# Patient Record
Sex: Male | Born: 1995 | Race: Black or African American | Hispanic: No | Marital: Single | Smoking: Current every day smoker
Health system: Southern US, Community
[De-identification: ages and names within clinical notes are randomized; demographics above are authoritative.]

## PROBLEM LIST (undated history)

## (undated) HISTORY — PX: TONSILLECTOMY: SUR1361

---

## 2019-03-15 ENCOUNTER — Other Ambulatory Visit: Payer: Self-pay

## 2019-03-15 ENCOUNTER — Ambulatory Visit: Payer: Self-pay | Admitting: Physician Assistant

## 2019-03-15 DIAGNOSIS — Z113 Encounter for screening for infections with a predominantly sexual mode of transmission: Secondary | ICD-10-CM

## 2019-03-15 LAB — GRAM STAIN

## 2019-03-16 ENCOUNTER — Encounter: Payer: Self-pay | Admitting: Physician Assistant

## 2019-03-16 NOTE — Progress Notes (Signed)
    STI clinic/screening visit  Subjective:  George Hislop. is a 23 y.o. male being seen today for an STI screening visit. The patient reports they do not have symptoms.  Patient has the following medical conditions:  There are no active problems to display for this patient.    Chief Complaint  Patient presents with  . SEXUALLY TRANSMITTED DISEASE    HPI  Patient reports that he does not have any symptoms and would like a screening today.    See flowsheet for further details and programmatic requirements.    The following portions of the patient's history were reviewed and updated as appropriate: allergies, current medications, past medical history, past social history, past surgical history and problem list.  Objective:  There were no vitals filed for this visit.  Physical Exam Constitutional:      General: He is not in acute distress.    Appearance: Normal appearance.  HENT:     Head: Normocephalic and atraumatic.     Mouth/Throat:     Mouth: Mucous membranes are moist.     Pharynx: Oropharynx is clear. No oropharyngeal exudate or posterior oropharyngeal erythema.  Eyes:     Conjunctiva/sclera: Conjunctivae normal.  Neck:     Musculoskeletal: Neck supple.  Pulmonary:     Effort: Pulmonary effort is normal.  Abdominal:     Palpations: Abdomen is soft. There is no mass.     Tenderness: There is no abdominal tenderness. There is no guarding or rebound.  Genitourinary:    Penis: Normal.      Scrotum/Testes: Normal.     Comments: Pubic area without nits, lice, edema, erythema, lesions and inguinal adenopathy. Penis circumcised and without discharge at meatus. Lymphadenopathy:     Cervical: No cervical adenopathy.  Skin:    General: Skin is warm and dry.     Findings: No bruising, erythema, lesion or rash.  Neurological:     Mental Status: He is alert and oriented to person, place, and time.  Psychiatric:        Mood and Affect: Mood normal.      Behavior: Behavior normal.        Thought Content: Thought content normal.        Judgment: Judgment normal.       Assessment and Plan:  George Cunningham. is a 23 y.o. male presenting to the [facility] Department for STI screening  1. Screening for STD (sexually transmitted disease) Patient is without symptoms today. Rec condoms with all sex. Await test results.  Counseled that RN will call if needs to RTC for treatment once results are back. - Gram stain - [facility] - Syphilis Serology, [facility] - GC/Chlamydia Probe Amp([facility])     No follow-ups on file.  No future appointments.  Jerene Dilling, PA

## 2019-03-17 LAB — GC/CHLAMYDIA PROBE AMP
Chlamydia trachomatis, NAA: NEGATIVE
Neisseria Gonorrhoeae by PCR: NEGATIVE

## 2019-09-12 ENCOUNTER — Emergency Department
Admission: EM | Admit: 2019-09-12 | Discharge: 2019-09-12 | Disposition: A | Discharge status: Home / Self Care | Payer: Self-pay | Attending: Student in an Organized Health Care Education/Training Program | Admitting: Student in an Organized Health Care Education/Training Program

## 2019-09-12 ENCOUNTER — Emergency Department: Payer: Self-pay

## 2019-09-12 ENCOUNTER — Other Ambulatory Visit: Payer: Self-pay

## 2019-09-12 DIAGNOSIS — M25531 Pain in right wrist: Secondary | ICD-10-CM | POA: Insufficient documentation

## 2019-09-12 DIAGNOSIS — Z008 Encounter for other general examination: Secondary | ICD-10-CM

## 2019-09-12 DIAGNOSIS — R1110 Vomiting, unspecified: Secondary | ICD-10-CM | POA: Insufficient documentation

## 2019-09-12 DIAGNOSIS — Z0289 Encounter for other administrative examinations: Secondary | ICD-10-CM | POA: Insufficient documentation

## 2019-09-12 DIAGNOSIS — M25561 Pain in right knee: Secondary | ICD-10-CM | POA: Insufficient documentation

## 2019-09-12 DIAGNOSIS — R064 Hyperventilation: Secondary | ICD-10-CM | POA: Insufficient documentation

## 2019-09-12 DIAGNOSIS — F17210 Nicotine dependence, cigarettes, uncomplicated: Secondary | ICD-10-CM | POA: Insufficient documentation

## 2019-09-12 DIAGNOSIS — M25562 Pain in left knee: Secondary | ICD-10-CM | POA: Insufficient documentation

## 2019-09-12 DIAGNOSIS — M25532 Pain in left wrist: Secondary | ICD-10-CM | POA: Insufficient documentation

## 2019-09-12 NOTE — ED Provider Notes (Signed)
[facility] Emergency Department Provider Note  ____________________________________________   First MD Initiated Contact with Patient 09/12/19 1800     (approximate)  I have reviewed the triage vital signs and the nursing notes.   HISTORY  Chief Complaint Shortness of Breath    HPI George Cunningham. is a 24 y.o. male presents emergency department via EMS and [place] [employer] custody.  Patient was running from the police and fell onto his knees and wrist.  I asked him why he was running and he said he was scared to the police.  He denies chest pain/shortness of breath but states he is having trouble breathing.  Vomited x1 in the ambulance per the [place] [employer].  No head injury, abdominal pain, is able to bear weight and walk    History reviewed. No pertinent past medical history.  There are no problems to display for this patient.   History reviewed. No pertinent surgical history.  Prior to Admission medications   Not on File    Allergies Patient has no known allergies.  History reviewed. No pertinent family history.  Social History Social History   Tobacco Use  . Smoking status: Current Every Day Smoker    Types: Cigarettes  . Smokeless tobacco: Never Used  Substance Use Topics  . Alcohol use: Never  . Drug use: Not Currently    Types: Marijuana    Review of Systems  Constitutional: No fever/chills Eyes: No visual changes. ENT: No sore throat. Respiratory: Denies cough, positive difficulty breathing Cardiovascular: Denies chest pain Gastrointestinal: Denies abdominal pain Genitourinary: Negative for dysuria. Musculoskeletal: Negative for back pain.  Positive left wrist pain, positive knee pain Skin: Negative for rash. Psychiatric: no mood changes,     ____________________________________________   PHYSICAL EXAM:  VITAL SIGNS: ED Triage Vitals  Enc Vitals Group     BP 09/12/19 1754 (!) 153/92     Pulse Rate 09/12/19 1754  (!) 122     Resp 09/12/19 1752 (!) 22     Temp 09/12/19 1754 98.1 F (36.7 C)     Temp Source 09/12/19 1754 Oral     SpO2 09/12/19 1754 96 %     Weight 09/12/19 1752 200 lb (90.7 kg)     Height 09/12/19 1752 6\' 1"  (1.854 m)     Head Circumference --      Peak Flow --      Pain Score 09/12/19 1752 0     Pain Loc --      Pain Edu? --      Excl. in [place]? --     Constitutional: Alert and oriented. Well appearing and in no acute distress.  Patient is hyperventilating Eyes: Conjunctivae are normal.  Head: Atraumatic. Nose: No congestion/rhinnorhea. Mouth/Throat: Mucous membranes are moist.   Neck:  supple no lymphadenopathy noted Cardiovascular: Normal rate, regular rhythm. Heart sounds are normal Respiratory: Normal respiratory effort.  No retractions, lungs c t a  Abd: soft nontender bs normal all 4 quad GU: deferred Musculoskeletal: FROM all extremities, warm and well perfused, left wrist is tender, right wrist is minimally tender, both knees are little tender but patient is able to bear weight Neurologic:  Normal speech and language.  Skin:  Skin is warm, dry and intact. No rash noted. Psychiatric: Mood and affect are normal. Speech and behavior are normal.  ____________________________________________   LABS (all labs ordered are listed, but only abnormal results are displayed)  Labs Reviewed - No data to display ____________________________________________  ____________________________________________  RADIOLOGY  Chest x-ray, x-ray of the left wrist Patient is refusing x-ray  ____________________________________________   PROCEDURES  Procedure(s) performed: No  Procedures    ____________________________________________   INITIAL IMPRESSION / ASSESSMENT AND PLAN / ED COURSE  Pertinent labs & imaging results that were available during my care of the patient were reviewed by me and considered in my medical decision making (see chart for details).   Patient  is 24 year old male presents emergency department in custody of the [place] [employer].  Patient was running from the police and fell.  He became short of breath while running and continues to say he is short of breath.  See HPI  Physical exam the patient is breathing normal when I am not evaluating him.  When I walk into the room he starts to hyperventilate.  Left wrist has no swelling or abrasions noted.  He states it hurts when palpated.  Left knees have dirt noted on his jeans, slightly tender to palpation but I did witness the patient ambulate without difficulty.  Chest x-ray left wrist x-ray Patient is refusing x-rays from the x-ray tech.  He will be discharged in stable condition in custody of [place] [employer].    George Cunningham. was evaluated in Emergency Department on 09/12/2019 for the symptoms described in the history of present illness. He was evaluated in the context of the global COVID-19 pandemic, which necessitated consideration that the patient might be at risk for infection with the SARS-CoV-2 virus that causes COVID-19. Institutional protocols and algorithms that pertain to the evaluation of patients at risk for COVID-19 are in a state of rapid change based on information released by regulatory bodies including the CDC and federal and state organizations. These policies and algorithms were followed during the patient's care in the ED.   As part of my medical decision making, I reviewed the following data within the electronic MEDICAL RECORD NUMBER Nursing notes reviewed and incorporated, Old chart reviewed, Notes from prior ED visits and [place] Controlled Substance Database  ____________________________________________   FINAL CLINICAL IMPRESSION(S) / ED DIAGNOSES  Final diagnoses:  Medical clearance for incarceration      NEW MEDICATIONS STARTED DURING THIS VISIT:  New Prescriptions   No medications on file     Note:  This document was prepared using Dragon voice recognition software  and may include unintentional dictation errors.    Faythe Ghee, PA-C 09/12/19 1816    Willy Eddy, MD 09/12/19 2038

## 2019-09-12 NOTE — ED Triage Notes (Addendum)
Pt arrives to ER via [facility] in [place] [employer] custody after running from police. Pt reported SOB. Pt had emesis X 1 in ambulance. Pt states he became SOB running from police. Reports bilateral knee pain when "falling" after running from police. No noted injuries at time of triage with patient's clothing on.  Pt needs medical clearance for [place] [employer]

## 2020-08-01 ENCOUNTER — Encounter: Payer: Self-pay | Admitting: Emergency Medicine

## 2020-08-01 ENCOUNTER — Other Ambulatory Visit: Payer: Self-pay

## 2020-08-01 ENCOUNTER — Emergency Department
Admission: EM | Admit: 2020-08-01 | Discharge: 2020-08-01 | Disposition: A | Discharge status: Home / Self Care | Payer: Self-pay | Attending: Emergency Medicine | Admitting: Emergency Medicine

## 2020-08-01 DIAGNOSIS — L03317 Cellulitis of buttock: Secondary | ICD-10-CM | POA: Insufficient documentation

## 2020-08-01 DIAGNOSIS — F17210 Nicotine dependence, cigarettes, uncomplicated: Secondary | ICD-10-CM | POA: Insufficient documentation

## 2020-08-01 DIAGNOSIS — R55 Syncope and collapse: Secondary | ICD-10-CM | POA: Insufficient documentation

## 2020-08-01 DIAGNOSIS — L0231 Cutaneous abscess of buttock: Secondary | ICD-10-CM | POA: Insufficient documentation

## 2020-08-01 LAB — URINALYSIS, COMPLETE (UACMP) WITH MICROSCOPIC
Bacteria, UA: NONE SEEN
Bilirubin Urine: NEGATIVE
Glucose, UA: NEGATIVE mg/dL
Hgb urine dipstick: NEGATIVE
Ketones, ur: NEGATIVE mg/dL
Leukocytes,Ua: NEGATIVE
Nitrite: NEGATIVE
Protein, ur: 30 mg/dL — AB
Specific Gravity, Urine: 1.031 — ABNORMAL HIGH (ref 1.005–1.030)
Squamous Epithelial / HPF: NONE SEEN (ref 0–5)
pH: 5 (ref 5.0–8.0)

## 2020-08-01 LAB — BASIC METABOLIC PANEL
Anion gap: 12 (ref 5–15)
BUN: 15 mg/dL (ref 6–20)
CO2: 23 mmol/L (ref 22–32)
Calcium: 9.5 mg/dL (ref 8.9–10.3)
Chloride: 100 mmol/L (ref 98–111)
Creatinine, Ser: 0.79 mg/dL (ref 0.61–1.24)
GFR, Estimated: >60 mL/min (ref >60)
Glucose, Bld: 176 mg/dL — ABNORMAL HIGH (ref 70–99)
Potassium: 4.2 mmol/L (ref 3.5–5.1)
Sodium: 135 mmol/L (ref 135–145)

## 2020-08-01 LAB — CBC
HCT: 44.8 % (ref 39.0–52.0)
Hemoglobin: 15.4 g/dL (ref 13.0–17.0)
MCH: 30 pg (ref 26.0–34.0)
MCHC: 34.4 g/dL (ref 30.0–36.0)
MCV: 87.3 fL (ref 80.0–100.0)
Platelets: 224 10*3/uL (ref 150–400)
RBC: 5.13 MIL/uL (ref 4.22–5.81)
RDW: 12.7 % (ref 11.5–15.5)
WBC: 11.1 10*3/uL — ABNORMAL HIGH (ref 4.0–10.5)
nRBC: 0 % (ref 0.0–0.2)

## 2020-08-01 MED ORDER — DOXYCYCLINE HYCLATE 100 MG PO TABS
100.0000 mg | ORAL_TABLET | Freq: Once | ORAL | Status: AC
  Administered 2020-08-01: 100 mg via ORAL
  Filled 2020-08-01: qty 1

## 2020-08-01 MED ORDER — DOXYCYCLINE HYCLATE 100 MG PO CAPS: 100.0000 mg | ORAL_CAPSULE | Freq: Two times a day (BID) | ORAL | 0 refills | Status: DC

## 2020-08-01 MED ORDER — ONDANSETRON 4 MG PO TBDP
4.0000 mg | ORAL_TABLET | Freq: Once | ORAL | Status: AC
  Administered 2020-08-01: 4 mg via ORAL
  Filled 2020-08-01: qty 1

## 2020-08-01 MED ORDER — LIDOCAINE-EPINEPHRINE 2 %-1:100000 IJ SOLN
20.0000 mL | Freq: Once | INTRAMUSCULAR | Status: AC
  Administered 2020-08-01: 20 mL via INTRADERMAL
  Filled 2020-08-01: qty 1

## 2020-08-01 MED ORDER — OXYCODONE-ACETAMINOPHEN 5-325 MG PO TABS
2.0000 | ORAL_TABLET | Freq: Once | ORAL | Status: AC
  Administered 2020-08-01: 2 via ORAL
  Filled 2020-08-01: qty 2

## 2020-08-01 NOTE — ED Triage Notes (Addendum)
Pt to ED via POV stating that he has an abscess on his buttocks. Pt states that last night he got up to go get a bath cloth and got very lightheaded and dizzy. Pt reports that he had syncopal episode. Pt girlfriend told him that he was shaking. Pt does not have hx/o seizures. Pt is in NAD. Pt reports that he donated plasma yesterday.

## 2020-08-01 NOTE — ED Provider Notes (Signed)
[facility] Emergency Department Provider Note  ____________________________________________   Event Date/Time   First MD Initiated Contact with Patient 08/01/20 1515     (approximate)  I have reviewed the triage vital signs and the nursing notes.   HISTORY  Chief Complaint Loss of Consciousness and Abscess    HPI George Cunningham. is a 25 y.o. male  Here with abscess, syncope.  Patient states that over the last several days, he has had progressively worsening gluteal pain and swelling.  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  He states that he has had increasing pain from this.  He states that he went to the bathroom to have a bowel movement and try to apply a warm compress yesterday.  When he got up after the bathroom to go to the other room, he felt very lightheaded and dizzy.  He then had what he describes as a sensation of dizziness and tunnel vision followed by a possible brief syncopal episode.  His symptoms then resolved.  He states that he went to his bed and slept.  He felt better.  Today, he has had increasing pain and swelling along his gluteal area so he presents for repeat evaluation.  Denies any chest pain.  No history of syncope.  No cardiac history.  No medication changes.         History reviewed. No pertinent past medical history.  There are no problems to display for this patient.   Past Surgical History:  Procedure Laterality Date  . TONSILLECTOMY      Prior to Admission medications   Medication Sig Start Date End Date Taking? Authorizing Provider  doxycycline (VIBRAMYCIN) 100 MG capsule Take 1 capsule (100 mg total) by mouth 2 (two) times daily for 7 days. 08/01/20 08/08/20 Yes Shaune Pollack, MD    Allergies Patient has no known allergies.  No family history on file.  Social History Social History   Tobacco Use  . Smoking status:  Current Every Day Smoker    Types: Cigarettes  . Smokeless tobacco: Never Used  Substance Use Topics  . Alcohol use: Never  . Drug use: Yes    Types: Marijuana    Review of Systems  Review of Systems  Constitutional: Positive for fatigue. Negative for chills and fever.  HENT: Negative for sore throat.   Respiratory: Negative for shortness of breath.   Cardiovascular: Negative for chest pain.  Gastrointestinal: Negative for abdominal pain.  Genitourinary: Negative for flank pain.  Musculoskeletal: Negative for neck pain.  Skin: Positive for rash and wound.  Allergic/Immunologic: Negative for immunocompromised state.  Neurological: Positive for syncope. Negative for weakness and numbness.  Hematological: Does not bruise/bleed easily.  All other systems reviewed and are negative.    ____________________________________________  PHYSICAL EXAM:      VITAL SIGNS: ED Triage Vitals  Enc Vitals Group     BP 08/01/20 1445 (!) 137/97     Pulse Rate 08/01/20 1445 92     Resp 08/01/20 1445 16     Temp 08/01/20 1445 98.4 F (36.9 C)     Temp Source 08/01/20 1445 Oral     SpO2 08/01/20 1445 96 %     Weight 08/01/20 1442 230 lb (104.3 kg)     Height 08/01/20 1442 6' 4.5" (1.943 m)     Head Circumference --      Peak Flow --  Pain Score 08/01/20 1442 10     Pain Loc --      Pain Edu? --      Excl. in [place]? --      Physical Exam Vitals and nursing note reviewed.  Constitutional:      General: He is not in acute distress.    Appearance: He is well-developed.  HENT:     Head: Normocephalic and atraumatic.  Eyes:     Conjunctiva/sclera: Conjunctivae normal.  Cardiovascular:     Rate and Rhythm: Normal rate and regular rhythm.     Heart sounds: Normal heart sounds. No murmur heard. No friction rub.  Pulmonary:     Effort: Pulmonary effort is normal. No respiratory distress.     Breath sounds: Normal breath sounds. No wheezing or rales.  Abdominal:     General: There is  no distension.     Palpations: Abdomen is soft.     Tenderness: There is no abdominal tenderness.  Genitourinary:    Comments: Approximately 3 x 2 cm fluctuant area of abscess along the left inferior medial buttock/gluteal cleft.  Areas of prior drainage and scarring noted.  No extension to the perirectal area. Musculoskeletal:     Cervical back: Neck supple.  Skin:    General: Skin is warm.     Capillary Refill: Capillary refill takes less than 2 seconds.  Neurological:     Mental Status: He is alert and oriented to person, place, and time.     Motor: No abnormal muscle tone.       ____________________________________________   LABS (all labs ordered are listed, but only abnormal results are displayed)  Labs Reviewed  BASIC METABOLIC PANEL - Abnormal; Notable for the following components:      Result Value   Glucose, Bld 176 (*)    All other components within normal limits  CBC - Abnormal; Notable for the following components:   WBC 11.1 (*)    All other components within normal limits  URINALYSIS, COMPLETE (UACMP) WITH MICROSCOPIC - Abnormal; Notable for the following components:   Color, Urine YELLOW (*)    APPearance HAZY (*)    Specific Gravity, Urine 1.031 (*)    Protein, ur 30 (*)    All other components within normal limits  CBG MONITORING, ED    ____________________________________________  EKG: Normal sinus rhythm, ventricular rate 91.  PR 170, QRS 96, QTc 430.  No acute ST elevations or depression.  No acute events of acute ischemia or infarct. ________________________________________  RADIOLOGY All imaging, including plain films, CT scans, and ultrasounds, independently reviewed by me, and interpretations confirmed via formal radiology reads.  ED MD interpretation:     Official radiology report(s): No results found.  ____________________________________________  PROCEDURES   Procedure(s) performed (including Critical Care):  .1-3 Lead EKG  Interpretation Performed by: Shaune Pollack, MD Authorized by: Shaune Pollack, MD     Interpretation: normal     ECG rate:  80-100   ECG rate assessment: normal     Rhythm: sinus rhythm     Ectopy: none     Conduction: normal   Comments:     Indication: syncope .Marland KitchenIncision and Drainage  Date/Time: 08/01/2020 6:31 PM Performed by: Shaune Pollack, MD Authorized by: Shaune Pollack, MD   Consent:    Consent obtained:  Verbal   Consent given by:  Patient   Risks discussed:  Bleeding, damage to other organs, incomplete drainage and pain   Alternatives discussed:  Alternative treatment Universal protocol:  Patient identity confirmed:  Verbally with patient Location:    Type:  Abscess   Size:  3x2   Location:  Anogenital   Anogenital location:  Gluteal cleft Pre-procedure details:    Skin preparation:  Antiseptic wash Sedation:    Sedation type:  None Anesthesia:    Anesthesia method:  Local infiltration   Local anesthetic:  Lidocaine 1% WITH epi Procedure type:    Complexity:  Simple Procedure details:    Incision types:  Single straight   Incision depth:  Dermal   Wound management:  Probed and deloculated and irrigated with saline   Drainage:  Purulent   Drainage amount:  Copious   Wound treatment:  Wound left open   Packing materials:  1/4 in iodoform gauze Post-procedure details:    Procedure completion:  Tolerated    ____________________________________________  INITIAL IMPRESSION / MDM / ASSESSMENT AND PLAN / ED COURSE  As part of my medical decision making, I reviewed the following data within the electronic MEDICAL RECORD NUMBER Nursing notes reviewed and incorporated, Old chart reviewed, Notes from prior ED visits, and [place] Controlled Substance Database       *George Mallnthony Andre Dealmeida Jr. was evaluated in Emergency Department on 08/01/2020 for the symptoms described in the history of present illness. He was evaluated in the context of the global COVID-19  pandemic, which necessitated consideration that the patient might be at risk for infection with the SARS-CoV-2 virus that causes COVID-19. Institutional protocols and algorithms that pertain to the evaluation of patients at risk for COVID-19 are in a state of rapid change based on information released by regulatory bodies including the CDC and federal and state organizations. These policies and algorithms were followed during the patient's care in the ED.  Some ED evaluations and interventions may be delayed as a result of limited staffing during the pandemic.*     Medical Decision Making: 25 year old male here with syncopal episode and gluteal abscess.  Suspect syncope in the setting of possible vasovagal reaction to pain as well as possible orthostasis in the setting of standing up from recent bowel movement.  His EKG here is normal with no arrhythmia or abnormalities. Lab work shows mild dehydration but otherwise is unremarkable.  No significant anemia.  Electrolytes largely within normal limits.  Mild hyperglycemia is likely reactive in the setting of infection.  No high risk features for syncope.  He feels better here.  Regarding his gluteal abscess, this was drained.  Clinically, symptoms are consistent with likely recurrent folliculitis in the setting of shaving in the area.  He has a history of multiple prior issues.  No signs of sepsis.  Drained and packed as above.  Will place on doxycycline, advised warm compresses, and outpatient follow-up.  Instructed him to avoid shaving this area, avoid scented soaps, ____________________________________________  FINAL CLINICAL IMPRESSION(S) / ED DIAGNOSES  Final diagnoses:  Vasovagal syncope  Cellulitis and abscess of buttock     MEDICATIONS GIVEN DURING THIS VISIT:  Medications  oxyCODONE-acetaminophen (PERCOCET/ROXICET) 5-325 MG per tablet 2 tablet (2 tablets Oral Given 08/01/20 1617)  lidocaine-EPINEPHrine (XYLOCAINE W/EPI) 2 %-1:100000 (with  pres) injection 20 mL (20 mLs Intradermal Given 08/01/20 1617)  doxycycline (VIBRA-TABS) tablet 100 mg (100 mg Oral Given 08/01/20 1617)  ondansetron (ZOFRAN-ODT) disintegrating tablet 4 mg (4 mg Oral Given 08/01/20 1617)     ED Discharge Orders         Ordered    doxycycline (VIBRAMYCIN) 100 MG capsule  2 times daily  08/01/20 1824           Note:  This document was prepared using Dragon voice recognition software and may include unintentional dictation errors.   Shaune Pollack, MD 08/01/20 [id number]

## 2020-08-01 NOTE — ED Notes (Signed)
Discharge instructions reviewed with pt. Pt calm , collective.   

## 2020-08-01 NOTE — Discharge Instructions (Addendum)
For your wound:  Apply warm compresses 2-3 x daily Soak in a warm bath at least daily Take the antibiotics Return to the Er or your doctor in 48 hours for packing removal  Do not shave the area directly, esp with a blade razor  Use non-scented soap in the area

## 2020-08-03 ENCOUNTER — Other Ambulatory Visit: Payer: Self-pay

## 2020-08-03 ENCOUNTER — Encounter: Payer: Self-pay | Admitting: Emergency Medicine

## 2020-08-03 ENCOUNTER — Emergency Department
Admission: EM | Admit: 2020-08-03 | Discharge: 2020-08-03 | Disposition: A | Discharge status: Home / Self Care | Payer: Self-pay | Attending: Emergency Medicine | Admitting: Emergency Medicine

## 2020-08-03 DIAGNOSIS — Z48817 Encounter for surgical aftercare following surgery on the skin and subcutaneous tissue: Secondary | ICD-10-CM | POA: Insufficient documentation

## 2020-08-03 DIAGNOSIS — L89319 Pressure ulcer of right buttock, unspecified stage: Secondary | ICD-10-CM | POA: Insufficient documentation

## 2020-08-03 DIAGNOSIS — Z5189 Encounter for other specified aftercare: Secondary | ICD-10-CM

## 2020-08-03 DIAGNOSIS — F17210 Nicotine dependence, cigarettes, uncomplicated: Secondary | ICD-10-CM | POA: Insufficient documentation

## 2020-08-03 DIAGNOSIS — Z79899 Other long term (current) drug therapy: Secondary | ICD-10-CM | POA: Insufficient documentation

## 2020-08-03 MED ORDER — SULFAMETHOXAZOLE-TRIMETHOPRIM 800-160 MG PO TABS
1.0000 | ORAL_TABLET | Freq: Once | ORAL | Status: AC
  Administered 2020-08-03: 1 via ORAL
  Filled 2020-08-03: qty 1

## 2020-08-03 MED ORDER — SULFAMETHOXAZOLE-TRIMETHOPRIM 800-160 MG PO TABS: 1.0000 | ORAL_TABLET | Freq: Two times a day (BID) | ORAL | 0 refills | Status: DC

## 2020-08-03 MED ORDER — HYDROCODONE-ACETAMINOPHEN 5-325 MG PO TABS
1.0000 | ORAL_TABLET | Freq: Once | ORAL | Status: AC
  Administered 2020-08-03: 1 via ORAL
  Filled 2020-08-03: qty 1

## 2020-08-03 NOTE — ED Notes (Addendum)
Pt presents to the ED for removal of the packing from his abscess in  that was drained two days ago. Pt states that the pain and swelling has gone down a little. Pt states he is unable to pick up his antibiotic because he doesn't have insurance and the medication was expensive. Pt is A&Ox4 and NAD.

## 2020-08-03 NOTE — Discharge Instructions (Signed)
Continue to monitor the wound for any changes.  Apply warm compresses over the dressing to promote healing.  Take the doxycycline antibiotic as directed until all pills are gone.  Follow-up with your primary provider return to the ED if needed.

## 2020-08-03 NOTE — ED Provider Notes (Incomplete)
[facility] Emergency Department Provider Note ____________________________________________  Time seen: 2303  I have reviewed the triage vital signs and the nursing notes.  HISTORY  Chief Complaint  packing removal   HPI George Cunningham. is a 25 y.o. male ***   History reviewed. No pertinent past medical history.  There are no problems to display for this patient.   Past Surgical History:  Procedure Laterality Date  . TONSILLECTOMY      Prior to Admission medications   Medication Sig Start Date End Date Taking? Authorizing Provider  sulfamethoxazole-trimethoprim (BACTRIM DS) 800-160 MG tablet Take 1 tablet by mouth 2 (two) times daily. 08/03/20  Yes Christapher Gillian, Charlesetta Ivory, PA-C    Allergies Patient has no known allergies.  History reviewed. No pertinent family history.  Social History Social History   Tobacco Use  . Smoking status: Current Every Day Smoker    Types: Cigarettes  . Smokeless tobacco: Never Used  Substance Use Topics  . Alcohol use: Never  . Drug use: Yes    Types: Marijuana    Review of Systems  Constitutional: Negative for fever. Eyes: Negative for visual changes. ENT: Negative for sore throat. Cardiovascular: Negative for chest pain. Respiratory: Negative for shortness of breath. Gastrointestinal: Negative for abdominal pain, vomiting and diarrhea. Genitourinary: Negative for dysuria. Musculoskeletal: Negative for back pain. Skin: Negative for rash. Neurological: Negative for headaches, focal weakness or numbness. ____________________________________________  PHYSICAL EXAM:  VITAL SIGNS: ED Triage Vitals  Enc Vitals Group     BP 08/03/20 2228 (!) 148/98     Pulse Rate 08/03/20 2228 100     Resp 08/03/20 2228 20     Temp 08/03/20 2228 98.8 F (37.1 C)     Temp Source 08/03/20 2228 Oral     SpO2 08/03/20 2228 100 %     Weight 08/03/20 2217 230 lb (104.3 kg)     Height 08/03/20 2217 6\' 4"  (1.93 m)      Head Circumference --      Peak Flow --      Pain Score 08/03/20 2217 8     Pain Loc --      Pain Edu? --      Excl. in GC? --     Constitutional: Alert and oriented. Well appearing and in no distress. Head: Normocephalic and atraumatic. Eyes: Conjunctivae are normal. PERRL. Normal extraocular movements Ears: Canals clear. TMs intact bilaterally. Nose: No congestion/rhinorrhea/epistaxis. Mouth/Throat: Mucous membranes are moist. Neck: Supple. No thyromegaly. Hematological/Lymphatic/Immunological: No cervical lymphadenopathy. Cardiovascular: Normal rate, regular rhythm. Normal distal pulses. Respiratory: Normal respiratory effort. No wheezes/rales/rhonchi. Gastrointestinal: Soft and nontender. No distention. Musculoskeletal: Nontender with normal range of motion in all extremities.  Neurologic:  Normal gait without ataxia. Normal speech and language. No gross focal neurologic deficits are appreciated. Skin:  Skin is warm, dry and intact. No rash noted. Psychiatric: Mood and affect are normal. Patient exhibits appropriate insight and judgment. ____________________________________________    {***LABS (pertinent positives/negatives)***}  ____________________________________________  {***EKG***}  ____________________________________________   {***RADIOLOGY***}  ____________________________________________  PROCEDURES  *** Procedures ____________________________________________  INITIAL IMPRESSION / ASSESSMENT AND PLAN / ED COURSE  {***summary/treatment plan - medications ?***}     George Cunningham. was evaluated in Emergency Department on 08/03/2020 for the symptoms described in the history of present illness. He was evaluated in the context of the global COVID-19 pandemic, which necessitated consideration that the patient might be at risk for infection with the SARS-CoV-2 virus that causes COVID-19.  Institutional protocols and algorithms that pertain to the  evaluation of patients at risk for COVID-19 are in a state of rapid change based on information released by regulatory bodies including the CDC and federal and state organizations. These policies and algorithms were followed during the patient's care in the ED.  I reviewed the patient's prescription history over the last 12 months in the multi-state controlled substances database(s) that includes [place], [place], [place], [place], [place], [place], [place], [place], [place], [place], [place], [place], [place], and [place].  Results were notable for *** ____________________________________________  FINAL CLINICAL IMPRESSION(S) / ED DIAGNOSES  Final diagnoses:  Wound check, abscess

## 2020-08-03 NOTE — ED Provider Notes (Signed)
[facility] Emergency Department Provider Note ____________________________________________  Time seen: 2303  I have reviewed the triage vital signs and the nursing notes.  HISTORY  Chief Complaint  packing removal  HPI Artrell Lawless. is a 25 y.o. male presents to the ED for interim wound check.  Patient was evaluated here in the ED 3 days prior for a  colitis of the buttock.  He had a local I&D procedure performed that time, and iodoform packing was placed.  He presents today for wound check and packing removal.  Patient admits that he did not pick up the doxycycline antibiotic, noting that the pharmacy gave him a retail price of nearly $400.  He presents today without any interim fever, chills, or sweats.  He been managing the wound with daily wound changes.  He denies any other complaints.  History reviewed. No pertinent past medical history.  There are no problems to display for this patient.   Past Surgical History:  Procedure Laterality Date  . TONSILLECTOMY      Prior to Admission medications   Medication Sig Start Date End Date Taking? Authorizing Provider  sulfamethoxazole-trimethoprim (BACTRIM DS) 800-160 MG tablet Take 1 tablet by mouth 2 (two) times daily. 08/03/20  Yes Nohelani Benning, Charlesetta Ivory, PA-C    Allergies Patient has no known allergies.  History reviewed. No pertinent family history.  Social History Social History   Tobacco Use  . Smoking status: Current Every Day Smoker    Types: Cigarettes  . Smokeless tobacco: Never Used  Substance Use Topics  . Alcohol use: Never  . Drug use: Yes    Types: Marijuana    Review of Systems  Constitutional: Negative for fever. Cardiovascular: Negative for chest pain. Respiratory: Negative for shortness of breath. Gastrointestinal: Negative for abdominal pain, vomiting and diarrhea. Genitourinary: Negative for dysuria. Musculoskeletal: Negative for back pain. Skin: Negative for  rash.  Right buttocks abscess as above. Neurological: Negative for headaches, focal weakness or numbness. ____________________________________________  PHYSICAL EXAM:  VITAL SIGNS: ED Triage Vitals  Enc Vitals Group     BP 08/03/20 2228 (!) 148/98     Pulse Rate 08/03/20 2228 100     Resp 08/03/20 2228 20     Temp 08/03/20 2228 98.8 F (37.1 C)     Temp Source 08/03/20 2228 Oral     SpO2 08/03/20 2228 100 %     Weight 08/03/20 2217 230 lb (104.3 kg)     Height 08/03/20 2217 6\' 4"  (1.93 m)     Head Circumference --      Peak Flow --      Pain Score 08/03/20 2217 8     Pain Loc --      Pain Edu? --      Excl. in GC? --     Constitutional: Alert and oriented. Well appearing and in no distress. Head: Normocephalic and atraumatic. Eyes: Conjunctivae are normal. Normal extraocular movements Cardiovascular: Normal rate, regular rhythm. Normal distal pulses. Respiratory: Normal respiratory effort. No wheezes/rales/rhonchi. Gastrointestinal: Soft and nontender. No distention. Musculoskeletal: Nontender with normal range of motion in all extremities.  Neurologic:  Normal gait without ataxia. Normal speech and language. No gross focal neurologic deficits are appreciated. Skin:  Skin is warm, dry and intact. No rash noted.  Patient with a healing right buttocks abscess on presentation.  He has a 4 cm area of in duration without significant erythema.  No central wound consistent with a previous I&D.  Iodoform packing  is in place.  Packing was removed without difficulty and no significant purulent drainage is expressed.  The wound flushed clean with saline. Psychiatric: Mood and affect are normal. Patient exhibits appropriate insight and judgment. ____________________________________________  PROCEDURES  Bactrim DS 1 p.o. Norco 5-3 25 p.o. Dry sterile dressing  Procedures ____________________________________________  INITIAL IMPRESSION / ASSESSMENT AND PLAN / ED COURSE  ED  evaluation management of a right buttocks abscess status post I&D procedure.  Patient presents without any interim complaints.  The wound appears to be healing well with no fluctuance or overlying erythema.  The packing was removed without difficulty and the patient's wound was covered with a non-stick dressing.  He was given an dose of Bactrim in the ED, and a prescription for the same.  Patient was advised to make contact with the ED if the antibiotic was again out of his price range.  He is given a discount prescription card to help defray cost.  He will keep the wound clean, dry, and covered.  Apply warm compress to help promote healing.  Return precautions have been discussed.  No interim follow-up is required.    Carel Carrier. was evaluated in Emergency Department on 08/04/2020 for the symptoms described in the history of present illness. He was evaluated in the context of the global COVID-19 pandemic, which necessitated consideration that the patient might be at risk for infection with the SARS-CoV-2 virus that causes COVID-19. Institutional protocols and algorithms that pertain to the evaluation of patients at risk for COVID-19 are in a state of rapid change based on information released by regulatory bodies including the CDC and federal and state organizations. These policies and algorithms were followed during the patient's care in the ED. ____________________________________________  FINAL CLINICAL IMPRESSION(S) / ED DIAGNOSES  Final diagnoses:  Wound check, abscess      Karmen Stabs, Charlesetta Ivory, PA-C 08/04/20 0009    Chesley Noon, MD 08/04/20 [phone]

## 2020-08-03 NOTE — ED Triage Notes (Signed)
Pt states is here to have his packing from his abscess removed. Pt states was told to return to ed to have packing removed. Pt appears in no acute distress.

## 2020-10-06 ENCOUNTER — Other Ambulatory Visit: Payer: Self-pay

## 2020-10-06 ENCOUNTER — Emergency Department
Admission: EM | Admit: 2020-10-06 | Discharge: 2020-10-06 | Disposition: A | Discharge status: Home / Self Care | Payer: Self-pay | Attending: Emergency Medicine | Admitting: Emergency Medicine

## 2020-10-06 DIAGNOSIS — F17210 Nicotine dependence, cigarettes, uncomplicated: Secondary | ICD-10-CM | POA: Insufficient documentation

## 2020-10-06 DIAGNOSIS — H60333 Swimmer's ear, bilateral: Secondary | ICD-10-CM | POA: Insufficient documentation

## 2020-10-06 MED ORDER — CIPROFLOXACIN-DEXAMETHASONE 0.3-0.1 % OT SUSP
4.0000 [drp] | Freq: Once | OTIC | Status: AC
  Administered 2020-10-06: 4 [drp] via OTIC
  Filled 2020-10-06: qty 7.5

## 2020-10-06 MED ORDER — CIPROFLOXACIN-DEXAMETHASONE 0.3-0.1 % OT SUSP: 4.0000 [drp] | Freq: Two times a day (BID) | OTIC | 0 refills | Status: AC

## 2020-10-06 NOTE — Discharge Instructions (Addendum)
Apply antibiotic eardrops 4 drops to each ear twice daily x7 days.  Return to the ER for worsening symptoms, persistent vomiting, fever or other concerns.

## 2020-10-06 NOTE — ED Provider Notes (Signed)
[facility] Emergency Department Provider Note   ____________________________________________   Event Date/Time   First MD Initiated Contact with Patient 10/06/20 [phone]     (approximate)  I have reviewed the triage vital signs and the nursing notes.   HISTORY  Chief Complaint Ear Pain    HPI George Cunningham. is a 25 y.o. male who presents to the ED from home with bilateral ear pain, right> left.  Patient reports recent trip to the beach and feels like both ears are clogged up with possible foreign body.  States his girlfriend noticed discharge from his right ear.  Denies fever, chills, nausea, vomiting or dizziness.     Past medical history None  There are no problems to display for this patient.   Past Surgical History:  Procedure Laterality Date  . TONSILLECTOMY      Prior to Admission medications   Medication Sig Start Date End Date Taking? Authorizing Provider  ciprofloxacin-dexamethasone (CIPRODEX) OTIC suspension Place 4 drops into both ears 2 (two) times daily for 7 days. 10/06/20 10/13/20 Yes Irean Hong, MD  sulfamethoxazole-trimethoprim (BACTRIM DS) 800-160 MG tablet Take 1 tablet by mouth 2 (two) times daily. 08/03/20   Menshew, Charlesetta Ivory, PA-C    Allergies Patient has no known allergies.  No family history on file.  Social History Social History   Tobacco Use  . Smoking status: Current Every Day Smoker    Types: Cigarettes  . Smokeless tobacco: Never Used  Substance Use Topics  . Alcohol use: Never  . Drug use: Yes    Types: Marijuana    Review of Systems  Constitutional: No fever/chills Eyes: No visual changes. ENT: Positive for bilateral ear pain.  No sore throat. Cardiovascular: Denies chest pain. Respiratory: Denies shortness of breath. Gastrointestinal: No abdominal pain.  No nausea, no vomiting.  No diarrhea.  No constipation. Genitourinary: Negative for dysuria. Musculoskeletal: Negative for back  pain. Skin: Negative for rash. Neurological: Negative for headaches, focal weakness or numbness.   ____________________________________________   PHYSICAL EXAM:  VITAL SIGNS: ED Triage Vitals [10/06/20 0219]  Enc Vitals Group     BP (!) 157/102     Pulse Rate 100     Resp 16     Temp 99 F (37.2 C)     Temp Source Oral     SpO2 100 %     Weight 239 lb (108.4 kg)     Height 6\' 5"  (1.956 m)     Head Circumference      Peak Flow      Pain Score 4     Pain Loc      Pain Edu?      Excl. in GC?     Constitutional: Alert and oriented. Well appearing and in no acute distress. Eyes: Conjunctivae are normal. PERRL. EOMI. Ears: Bilateral TMs intact without rupture.  No tenderness to mastoid process.  Debris and cerumen noted to bilateral external ear canals. Head: Atraumatic. Nose: No congestion/rhinnorhea. Mouth/Throat: Mucous membranes are moist.  Oropharynx non-erythematous. Neck: No stridor.   Cardiovascular: Normal rate, regular rhythm. Grossly normal heart sounds.  Good peripheral circulation. Respiratory: Normal respiratory effort.  No retractions. Lungs CTAB. Gastrointestinal: Soft and nontender. No distention. No abdominal bruits. No CVA tenderness. Musculoskeletal: No lower extremity tenderness nor edema.  No joint effusions. Neurologic:  Normal speech and language. No gross focal neurologic deficits are appreciated. No gait instability. Skin:  Skin is warm, dry and intact. No rash  noted. Psychiatric: Mood and affect are normal. Speech and behavior are normal.  ____________________________________________   LABS (all labs ordered are listed, but only abnormal results are displayed)  Labs Reviewed - No data to display ____________________________________________  EKG  None ____________________________________________  RADIOLOGY I, Ransome Helwig J, personally viewed and evaluated these images (plain radiographs) as part of my medical decision making, as well as  reviewing the written report by the radiologist.  ED MD interpretation: None  Official radiology report(s): No results found.  ____________________________________________   PROCEDURES  Procedure(s) performed (including Critical Care):  Procedures   ____________________________________________   INITIAL IMPRESSION / ASSESSMENT AND PLAN / ED COURSE  As part of my medical decision making, I reviewed the following data within the electronic MEDICAL RECORD NUMBER Nursing notes reviewed and incorporated and Notes from prior ED visits     25 year old male presenting with bilateral otitis externa.  Will apply Ciprodex drops.  Strict return precautions given.  Patient verbalizes understanding agrees with plan of care.      ____________________________________________   FINAL CLINICAL IMPRESSION(S) / ED DIAGNOSES  Final diagnoses:  Acute swimmer's ear of both sides     ED Discharge Orders         Ordered    ciprofloxacin-dexamethasone (CIPRODEX) OTIC suspension  2 times daily        10/06/20 0348          *Please note:  George Cunningham. was evaluated in Emergency Department on 10/06/2020 for the symptoms described in the history of present illness. He was evaluated in the context of the global COVID-19 pandemic, which necessitated consideration that the patient might be at risk for infection with the SARS-CoV-2 virus that causes COVID-19. Institutional protocols and algorithms that pertain to the evaluation of patients at risk for COVID-19 are in a state of rapid change based on information released by regulatory bodies including the CDC and federal and state organizations. These policies and algorithms were followed during the patient's care in the ED.  Some ED evaluations and interventions may be delayed as a result of limited staffing during and the pandemic.*   Note:  This document was prepared using Dragon voice recognition software and may include unintentional  dictation errors.   Irean Hong, MD 10/06/20 [phone]

## 2020-10-06 NOTE — ED Triage Notes (Signed)
Pt states right ear pain and sensation of possible foreign body for last 10 minutes. Pt appears in no acute distress.

## 2020-12-05 ENCOUNTER — Emergency Department: Payer: Self-pay

## 2020-12-05 ENCOUNTER — Emergency Department
Admission: EM | Admit: 2020-12-05 | Discharge: 2020-12-05 | Disposition: A | Discharge status: Home / Self Care | Payer: Self-pay | Attending: Emergency Medicine | Admitting: Emergency Medicine

## 2020-12-05 ENCOUNTER — Other Ambulatory Visit: Payer: Self-pay

## 2020-12-05 DIAGNOSIS — N492 Inflammatory disorders of scrotum: Secondary | ICD-10-CM | POA: Insufficient documentation

## 2020-12-05 DIAGNOSIS — L0291 Cutaneous abscess, unspecified: Secondary | ICD-10-CM

## 2020-12-05 DIAGNOSIS — F17210 Nicotine dependence, cigarettes, uncomplicated: Secondary | ICD-10-CM | POA: Insufficient documentation

## 2020-12-05 LAB — URINALYSIS, COMPLETE (UACMP) WITH MICROSCOPIC
Bilirubin Urine: NEGATIVE
Glucose, UA: >=500 mg/dL — AB
Hgb urine dipstick: NEGATIVE
Ketones, ur: 5 mg/dL — AB
Leukocytes,Ua: NEGATIVE
Nitrite: NEGATIVE
Protein, ur: NEGATIVE mg/dL
Specific Gravity, Urine: 1.026 (ref 1.005–1.030)
pH: 6 (ref 5.0–8.0)

## 2020-12-05 LAB — CHLAMYDIA/NGC RT PCR (ARMC ONLY)
Chlamydia Tr: NOT DETECTED
N gonorrhoeae: NOT DETECTED

## 2020-12-05 MED ORDER — LIDOCAINE HCL (PF) 1 % IJ SOLN
5.0000 mL | Freq: Once | INTRAMUSCULAR | Status: AC
  Administered 2020-12-05: 5 mL via INTRADERMAL
  Filled 2020-12-05: qty 5

## 2020-12-05 MED ORDER — DOXYCYCLINE HYCLATE 100 MG PO CAPS: 100.0000 mg | ORAL_CAPSULE | Freq: Two times a day (BID) | ORAL | 0 refills | Status: AC

## 2020-12-05 MED ORDER — BUPIVACAINE HCL (PF) 0.5 % IJ SOLN
10.0000 mL | Freq: Once | INTRAMUSCULAR | Status: AC
  Administered 2020-12-05: 10 mL
  Filled 2020-12-05: qty 10

## 2020-12-05 MED ORDER — IBUPROFEN 600 MG PO TABS: 600.0000 mg | ORAL_TABLET | Freq: Three times a day (TID) | ORAL | 0 refills | Status: DC | PRN

## 2020-12-05 NOTE — Consult Note (Signed)
Full consult to follow. Scrotal abscess. I&D kit, lidocaine, packing to bedside- discussed with Nurse.

## 2020-12-05 NOTE — ED Provider Notes (Signed)
[facility] Emergency Department Provider Note  ____________________________________________   Event Date/Time   First MD Initiated Contact with Patient 12/05/20 1554     (approximate)  I have reviewed the triage vital signs and the nursing notes.   HISTORY  Chief Complaint Abscess   HPI George Cunningham. is a 25 y.o. male who presents to the emergency department for evaluation of pain near his rectum.  States that has been worsening for the last few days, thinks that he has an abscess.  Denies significant history of previous.  Denies any dysuria, penile discharge, fever, chills or other systemic symptoms.         No past medical history on file.  There are no problems to display for this patient.   Past Surgical History:  Procedure Laterality Date   TONSILLECTOMY      Prior to Admission medications   Not on File    Allergies Patient has no known allergies.  No family history on file.  Social History Social History   Tobacco Use   Smoking status: Every Day    Pack years: 0.00    Types: Cigarettes   Smokeless tobacco: Never  Substance Use Topics   Alcohol use: Never   Drug use: Yes    Types: Marijuana    Review of Systems Constitutional: No fever/chills Eyes: No visual changes. ENT: No sore throat. Cardiovascular: Denies chest pain. Respiratory: Denies shortness of breath. Gastrointestinal: No abdominal pain.  No nausea, no vomiting.  No diarrhea.  No constipation. Genitourinary: Negative for dysuria. Musculoskeletal: Negative for back pain. Skin: Negative for rash. Neurological: Negative for headaches, focal weakness or numbness.   ____________________________________________   PHYSICAL EXAM:  VITAL SIGNS: ED Triage Vitals  Enc Vitals Group     BP 12/05/20 1106 140/87     Pulse Rate 12/05/20 1106 90     Resp 12/05/20 1106 18     Temp 12/05/20 1106 (!) 97.5 F (36.4 C)     Temp Source 12/05/20 1106  Oral     SpO2 12/05/20 1106 97 %     Weight 12/05/20 1107 236 lb (107 kg)     Height 12/05/20 1107 6\' 5"  (1.956 m)     Head Circumference --      Peak Flow --      Pain Score 12/05/20 1106 10     Pain Loc --      Pain Edu? --      Excl. in GC? --     Constitutional: Alert and oriented. Well appearing and in no acute distress. Eyes: Conjunctivae are normal. PERRL. EOMI. Head: Atraumatic. Nose: No congestion/rhinnorhea. Mouth/Throat: Mucous membranes are moist.  Neck: No stridor.   Cardiovascular: Normal rate, regular rhythm. Grossly normal heart sounds.  Good peripheral circulation. Respiratory: Normal respiratory effort.  No retractions. Lungs CTAB. Gastrointestinal: Soft and nontender. No distention. No abdominal bruits. No CVA tenderness. Genitourinary: There is noted area of tenderness with fluctuance and surrounding induration of the posterior wall of the right side of the scrotum.  No active draining.  No urethral discharge or other changes noted. Musculoskeletal: No lower extremity tenderness nor edema.  No joint effusions. Neurologic:  Normal speech and language. No gross focal neurologic deficits are appreciated. No gait instability. Skin:  Skin is warm, dry and intact. No rash noted. Psychiatric: Mood and affect are normal. Speech and behavior are normal.  ____________________________________________   LABS (all labs ordered are listed, but only abnormal results are  displayed)  Labs Reviewed  URINALYSIS, COMPLETE (UACMP) WITH MICROSCOPIC - Abnormal; Notable for the following components:      Result Value   Color, Urine YELLOW (*)    APPearance HAZY (*)    Glucose, UA >=500 (*)    Ketones, ur 5 (*)    Bacteria, UA RARE (*)    All other components within normal limits  CHLAMYDIA/NGC RT PCR ([facility] ONLY)             ____________________________________________  RADIOLOGY  Official radiology report(s): US SCROTUM W/DOPPLER  Result Date: 12/05/2020 CLINICAL DATA:   Scrotal wall abscess. EXAM: SCROTAL ULTRASOUND DOPPLER ULTRASOUND OF THE TESTICLES TECHNIQUE: Complete ultrasound examination of the testicles, epididymis, and other scrotal structures was performed. Color and spectral Doppler ultrasound were also utilized to evaluate blood flow to the testicles. COMPARISON:  None. FINDINGS: Right testicle Measurements: 5.0 x 2.7 x 3.3 cm. No mass or microlithiasis visualized. Left testicle Measurements: 5.3 x 2.3 x 3.2 cm. No mass or microlithiasis visualized. Right epididymis:  Normal in size and appearance. Left epididymis:  Normal in size and appearance. Hydrocele:  Small bilateral hydroceles. Varicocele:  None visualized. Pulsed Doppler interrogation of both testes demonstrates normal low resistance arterial and venous waveforms bilaterally. There is a 4.4 x 1.5 x 2.3 cm complex fluid collection with surrounding hyperemia along the underside of the right scrotum corresponding to the area of clinical concern. IMPRESSION: 1. 4.4 x 1.5 x 2.3 cm abscess along the underside of the right scrotum, corresponding to the area of clinical concern. 2. Normal sonographic appearance of the testicles. Electronically Signed   By: Obie Dredge M.D.   On: 12/05/2020 14:35     ____________________________________________   INITIAL IMPRESSION / ASSESSMENT AND PLAN / ED COURSE  As part of my medical decision making, I reviewed the following data within the electronic MEDICAL RECORD NUMBER Nursing notes reviewed and incorporated, Labs reviewed, A consult was requested and obtained from this/these consultant(s) Urology, Evaluated by EM attending Dr. Erma Heritage, and Notes from prior ED visits        Patient is a 25 year old male who presents to the emergency department for evaluation of pain in his genital region.  He states that this has been worsening over the last 3 days.  See HPI.  Exam as above.  Urinalysis was obtained and demonstrates rare bacteria with no leukocytes or nitrates.  GC  and Chlamydia are negative.  Ultrasound was obtained of the scrotum given location of the suspected abscess.  Ultrasound demonstrates a 4.4 x 1.5 x 2.3 cm abscess on the underside of the right side of the scrotum.  Case was discussed with Dr. Erma Heritage, who recommended urology consult.  Dr. Mena Goes of on-call urology was consulted.  He returned call to nursing asking for bedside supplies for drainage.  At this time, patient is being handed off to attending Dr. Erma Heritage, who will continue to await urology seeing the patient and likely discharge after that time.  Patient stable at this time for transfer to Dr. Erma Heritage.  Clinical Course as of 12/05/20 1630  Fri Dec 05, 2020  1506 Spoke with Dr. Mena Goes of urology, who is reviewing the patient's imaging to determine next steps  [CR]    Clinical Course User Index [CR] Lucy Chris, PA     ____________________________________________   FINAL CLINICAL IMPRESSION(S) / ED DIAGNOSES  Final diagnoses:  Scrotal wall abscess     ED Discharge Orders     None  Note:  This document was prepared using Dragon voice recognition software and may include unintentional dictation errors.    Lucy Chris, PA 12/05/20 1636    Shaune Pollack, MD 12/10/20 [phone]

## 2020-12-05 NOTE — ED Triage Notes (Signed)
Pt here with a abscess that started 3 days ago. Pt states that it came up on the right inside of his buttocks and has not went down and is painful.

## 2020-12-05 NOTE — Discharge Instructions (Addendum)
It is OK to remove the packing tomorrow.  Take the antibiotics as prescribed.  Incision and Drainage, Care After This sheet gives you information about how to care for yourself after your procedure. Your health care provider may also give you more specific instructions. If you have problems or questions, contact your health careprovider. What can I expect after the procedure? After the procedure, it is common to have: Pain or discomfort around the incision site. Blood, fluid, or pus (drainage) from the incision. Redness and firm skin around the incision site. Follow these instructions at home: Medicines Take over-the-counter and prescription medicines only as told by your health care provider. If you were prescribed an antibiotic medicine, use or take it as told by your health care provider. Do not stop using the antibiotic even if you start to feel better. Wound care  Follow instructions from your health care provider about how to take care of your wound. Make sure you: Wash your hands with soap and water before and after you change your bandage (dressing). If soap and water are not available, use hand sanitizer. Change your dressing and packing as told by your health care provider. If your dressing is dry or stuck when you try to remove it, moisten or wet the dressing with saline or water so that it can be removed without harming your skin or tissues. If your wound is packed, leave it in place until your health care provider tells you to remove it. To remove the packing, moisten or wet the packing with saline or water so that it can be removed without harming your skin or tissues. Leave stitches (sutures), skin glue, or adhesive strips in place. These skin closures may need to stay in place for 2 weeks or longer. If adhesive strip edges start to loosen and curl up, you may trim the loose edges. Do not remove adhesive strips completely unless your health care provider tells you to do  that. Check your wound every day for signs of infection. Check for: More redness, swelling, or pain. More fluid or blood. Warmth. Pus or a bad smell. If you were sent home with a drain tube in place, follow instructions from your health care provider about: How to empty it. How to care for it at home.   General instructions Rest the affected area. Do not take baths, swim, or use a hot tub until your health care provider approves. Ask your health care provider if you may take showers. You may only be allowed to take sponge baths. Return to your normal activities as told by your health care provider. Ask your health care provider what activities are safe for you. Your health care provider may put you on activity or lifting restrictions. The incision will continue to drain. It is normal to have some clear or slightly bloody drainage. The amount of drainage should lessen each day. Do not apply any creams, ointments, or liquids unless you have been told to by your health care provider. Keep all follow-up visits as told by your health care provider. This is important. Contact a health care provider if: Your cyst or abscess returns. You have a fever or chills. You have more redness, swelling, or pain around your incision. You have more fluid or blood coming from your incision. Your incision feels warm to the touch. You have pus or a bad smell coming from your incision. You have red streaks above or below the incision site. Get help right away if: You have  severe pain or bleeding. You cannot eat or drink without vomiting. You have decreased urine output. You become short of breath. You have chest pain. You cough up blood. The affected area becomes numb or starts to tingle. These symptoms may represent a serious problem that is an emergency. Do not wait to see if the symptoms will go away. Get medical help right away. Call your local emergency services (911 in the [place] Do not drive yourself  to the hospital. Summary After this procedure, it is common to have fluid, blood, or pus coming from the surgery site. Follow all home care instructions. You will be told how to take care of your incision, how to check for infection, and how to take medicines. If you were prescribed an antibiotic medicine, take it as told by your health care provider. Do not stop taking the antibiotic even if you start to feel better. Contact a health care provider if you have increased redness, swelling, or pain around your incision. Get help right away if you have chest pain, you vomit, you cough up blood, or you have shortness of breath. Keep all follow-up visits as told by your health care provider. This is important. This information is not intended to replace advice given to you by your health care provider. Make sure you discuss any questions you have with your healthcare provider. Document Revised: 05/01/2018 Document Reviewed: 05/01/2018 Elsevier Patient Education  2022 [employer].

## 2020-12-05 NOTE — Consult Note (Signed)
Consultation: Perineal abscess Requested by: Dr. Shaune Pollack  History of Present Illness: George Cunningham is a 25 year old male who has developed an abscess of the right perineum at the base of the scrotum.  He has had some pain, swelling and induration in the area for a few days that seemed more severe today.  He has had no fever.  He had a similar episode in the left perineum or thigh previously.  No past medical history on file. Past Surgical History:  Procedure Laterality Date   TONSILLECTOMY      Home Medications:  (Not in a hospital admission)  Allergies: No Known Allergies  No family history on file. Social History:  reports that he has been smoking cigarettes. He has never used smokeless tobacco. He reports current drug use. Drug: Marijuana. He reports that he does not drink alcohol.  ROS: A complete review of systems was performed.  All systems are negative except for pertinent findings as noted. Review of Systems  All other systems reviewed and are negative.   Physical Exam:  Vital signs in last 24 hours: Temp:  [97.5 F (36.4 C)] 97.5 F (36.4 C) (06/24 1106) Pulse Rate:  [90] 90 (06/24 1106) Resp:  [18] 18 (06/24 1106) BP: (140)/(87) 140/87 (06/24 1106) SpO2:  [97 %] 97 % (06/24 1106) Weight:  [107 kg] 107 kg (06/24 1107) General:  Alert and oriented, No acute distress HEENT: Normocephalic, atraumatic Neck: No JVD or lymphadenopathy Cardiovascular: Regular rate and rhythm Lungs: Regular rate and effort Abdomen: Soft, nontender, nondistended, no abdominal masses Back: No CVA tenderness Extremities: No edema Neurologic: Grossly intact GU: Penis circumcised and without mass or lesion, scrotum normal, testicles descended bilaterally and palpably normal.  At the base of the right scrotum on the right perineum there is about a 4 cm fluctuant abscess with some induration around it.  There is no crepitus or necrosis of the skin.  There is no involvement further  posterior around the perianal area.  It is coming to a head with a slight whitish area and fluctuant spot.  Procedure: I discussed with him the nature risk benefits and alternatives to incision and drainage and he consented to proceed.  The right abscess was prepped with Betadine and then a 50-50 mixture of Marcaine lidocaine with epi was instilled in the skin about 5 cc.  Already the abscess began to drain pus.  It was incised with an 11 blade and the pus drained.  I probed under the indurated skin superiorly and inferiorly to break up all the abscess cavity.  I then poured some saline in the Betadine to create irrigation.  I copiously irrigated the abscess cavity.  It was then packed with iodoform covered with gauze.  Laboratory Data:  Results for orders placed or performed during the hospital encounter of 12/05/20 (from the past 24 hour(s))  Urinalysis, Complete w Microscopic Urine, Random     Status: Abnormal   Collection Time: 12/05/20 12:14 PM  Result Value Ref Range   Color, Urine YELLOW (A) YELLOW   APPearance HAZY (A) CLEAR   Specific Gravity, Urine 1.026 1.005 - 1.030   pH 6.0 5.0 - 8.0   Glucose, UA >=500 (A) NEGATIVE mg/dL   Hgb urine dipstick NEGATIVE NEGATIVE   Bilirubin Urine NEGATIVE NEGATIVE   Ketones, ur 5 (A) NEGATIVE mg/dL   Protein, ur NEGATIVE NEGATIVE mg/dL   Nitrite NEGATIVE NEGATIVE   Leukocytes,Ua NEGATIVE NEGATIVE   RBC / HPF 0-5 0 - 5 RBC/hpf  WBC, UA 0-5 0 - 5 WBC/hpf   Bacteria, UA RARE (A) NONE SEEN   Squamous Epithelial / LPF 0-5 0 - 5   Mucus PRESENT   Chlamydia/NGC rt PCR ([facility] only)     Status: None   Collection Time: 12/05/20 12:14 PM   Specimen: Urine  Result Value Ref Range   Specimen source GC/Chlam ENDOCERVICAL    Chlamydia Tr NOT DETECTED NOT DETECTED   N gonorrhoeae NOT DETECTED NOT DETECTED   Recent Results (from the past 240 hour(s))  Chlamydia/NGC rt PCR ([facility] only)     Status: None   Collection Time: 12/05/20 12:14 PM   Specimen:  Urine  Result Value Ref Range Status   Specimen source GC/Chlam ENDOCERVICAL  Final   Chlamydia Tr NOT DETECTED NOT DETECTED Final   N gonorrhoeae NOT DETECTED NOT DETECTED Final    Comment: (NOTE) This CT/NG assay has not been evaluated in patients with a history of  hysterectomy. Performed at [facility], [address] [place], [place] [address]    Creatinine: No results for input(s): CREATININE in the last 168 hours.  Impression/Assessment/plan:  Perineal abscess-status post I&D.  I would discharge him on 5 to 7 days of doxycycline or Bactrim.  He can remove the packing tomorrow and I showed him how to do that.  Jerilee Field 12/05/2020, 6:37 PM

## 2020-12-05 NOTE — ED Notes (Signed)
See triage note  Presents with possible abscess area to scrotum    States noticed area couple of days ago   Now area is larger

## 2021-05-16 ENCOUNTER — Encounter: Payer: Self-pay | Admitting: Emergency Medicine

## 2021-05-16 ENCOUNTER — Emergency Department
Admission: EM | Admit: 2021-05-16 | Discharge: 2021-05-16 | Disposition: A | Discharge status: Home / Self Care | Payer: Self-pay | Attending: Emergency Medicine | Admitting: Emergency Medicine

## 2021-05-16 ENCOUNTER — Other Ambulatory Visit: Payer: Self-pay

## 2021-05-16 DIAGNOSIS — F17210 Nicotine dependence, cigarettes, uncomplicated: Secondary | ICD-10-CM | POA: Insufficient documentation

## 2021-05-16 DIAGNOSIS — Z20822 Contact with and (suspected) exposure to covid-19: Secondary | ICD-10-CM | POA: Insufficient documentation

## 2021-05-16 DIAGNOSIS — R519 Headache, unspecified: Secondary | ICD-10-CM

## 2021-05-16 DIAGNOSIS — J0100 Acute maxillary sinusitis, unspecified: Secondary | ICD-10-CM | POA: Insufficient documentation

## 2021-05-16 LAB — RESP PANEL BY RT-PCR (FLU A&B, COVID) ARPGX2
Influenza A by PCR: NEGATIVE
Influenza B by PCR: NEGATIVE
SARS Coronavirus 2 by RT PCR: NEGATIVE

## 2021-05-16 MED ORDER — AMOXICILLIN 500 MG PO TABS: 500.0000 mg | ORAL_TABLET | Freq: Three times a day (TID) | ORAL | 0 refills | Status: DC

## 2021-05-16 NOTE — ED Notes (Signed)
Provided pt with ice pack

## 2021-05-16 NOTE — ED Provider Notes (Signed)
[facility] Emergency Department Provider Note  ____________________________________________  Time seen: Approximately 9:28 AM  I have reviewed the triage vital signs and the nursing notes.   HISTORY  Chief Complaint Facial Pain   HPI Lewi Drost. is a 25 y.o. male presents to the emergency department for treatment and evaluation of right-sided facial pain and headache for the past 5 days. He has had URI symptoms for 2 weeks. No fever.  History reviewed. No pertinent past medical history.  There are no problems to display for this patient.   Past Surgical History:  Procedure Laterality Date   TONSILLECTOMY      Prior to Admission medications   Medication Sig Start Date End Date Taking? Authorizing Provider  amoxicillin (AMOXIL) 500 MG tablet Take 1 tablet (500 mg total) by mouth 3 (three) times daily. 05/16/21  Yes Jessica Checketts B, FNP  ibuprofen (ADVIL) 600 MG tablet Take 1 tablet (600 mg total) by mouth every 8 (eight) hours as needed for moderate pain. 12/05/20   Shaune Pollack, MD    Allergies Patient has no known allergies.  No family history on file.  Social History Social History   Tobacco Use   Smoking status: Every Day    Types: Cigarettes   Smokeless tobacco: Never  Substance Use Topics   Alcohol use: Never   Drug use: Yes    Types: Marijuana    Review of Systems Constitutional: Positive fever/chills.  Normal appetite. ENT: Negative for sore throat. Positive for right side facial pain. Cardiovascular: Denies chest pain. Respiratory: Negative for shortness of breath.  Positive for cough.  Negative for wheezing.  Gastrointestinal: No nausea, no vomiting.  No diarrhea.  Musculoskeletal: Positive for body aches Skin: Negative for rash. Neurological: Positive for headaches ____________________________________________   PHYSICAL EXAM:  VITAL SIGNS: ED Triage Vitals  Enc Vitals Group     BP 05/16/21 0206 (!)  165/110     Pulse Rate 05/16/21 0206 78     Resp 05/16/21 0206 20     Temp 05/16/21 0206 100.2 F (37.9 C)     Temp Source 05/16/21 0206 Oral     SpO2 05/16/21 0206 98 %     Weight 05/16/21 0207 226 lb (102.5 kg)     Height 05/16/21 0207 6\' 5"  (1.956 m)     Head Circumference --      Peak Flow --      Pain Score 05/16/21 0219 10     Pain Loc --      Pain Edu? --      Excl. in GC? --     Constitutional: Alert and oriented. Overall well appearing and in no acute distress. Eyes: Conjunctivae are normal. Ears: TM normal Nose: Maxillary sinus congestion noted; no rhinnorhea. Pain over maxillary sinus on right with light percussion. Mouth/Throat: Mucous membranes are moist.  Oropharynx mildly erythematous. Tonsils flat. Uvula midline. Neck: No stridor.  Lymphatic: No cervical lymphadenopathy. Cardiovascular: Normal rate, regular rhythm. Good peripheral circulation. Respiratory: Respirations are even and unlabored.  No retractions. Breath sounds clear. Gastrointestinal: Soft and nontender.  Musculoskeletal: FROM x 4 extremities.  Neurologic:  Normal speech and language. Skin:  Skin is warm, dry and intact. No rash noted. Psychiatric: Mood and affect are normal. Speech and behavior are normal.  ____________________________________________   LABS (all labs ordered are listed, but only abnormal results are displayed)  Labs Reviewed  RESP PANEL BY RT-PCR (FLU A&B, COVID) ARPGX2   ____________________________________________  EKG  Not indicated. ____________________________________________  RADIOLOGY  Not indicated. ____________________________________________   PROCEDURES  Procedure(s) performed: None  Critical Care performed: No ____________________________________________   INITIAL IMPRESSION / ASSESSMENT AND PLAN / ED COURSE  25 y.o. male presents to the emergency department for treatment and evaluation of right-sided facial pain and headache.  See HPI for  further details.  Influenza and COVID test negative.  On exam, he does have tenderness over the right maxillary sinus.  Plan will be to treat him with amoxicillin and have him continue taking ibuprofen or Tylenol.  He is to follow-up with primary care or return to the emergency department for symptoms of change or worsen.  Medications - No data to display  ED Discharge Orders          Ordered    amoxicillin (AMOXIL) 500 MG tablet  3 times daily        05/16/21 0943             Pertinent labs & imaging results that were available during my care of the patient were reviewed by me and considered in my medical decision making (see chart for details).    If controlled substance prescribed during this visit, 12 month history viewed on the [facility] prior to issuing an initial prescription for Schedule II or III opiod. ____________________________________________   FINAL CLINICAL IMPRESSION(S) / ED DIAGNOSES  Final diagnoses:  Sinus headache  Acute non-recurrent maxillary sinusitis    Note:  This document was prepared using Dragon voice recognition software and may include unintentional dictation errors.     Chinita Pester, FNP 05/16/21 1033    Minna Antis, MD 05/16/21 1504

## 2021-05-16 NOTE — ED Triage Notes (Signed)
Pt reports right side pain for 5 days. Reports woke up with a headache about 5 days ago, reports everyone one at home has the flu. Pt reports cough, and when he coughs the right side of his face increases in pain. Pt talks in complete sentences no distress noted

## 2021-05-16 NOTE — Discharge Instructions (Signed)
Continue ibuprofen. Take antibiotics until finished. Follow up with primary care or return to the ER for concerns.

## 2021-09-03 ENCOUNTER — Other Ambulatory Visit: Payer: Self-pay

## 2021-09-03 ENCOUNTER — Emergency Department
Admission: EM | Admit: 2021-09-03 | Discharge: 2021-09-03 | Disposition: A | Discharge status: Home / Self Care | Payer: Self-pay | Attending: Emergency Medicine | Admitting: Emergency Medicine

## 2021-09-03 DIAGNOSIS — J020 Streptococcal pharyngitis: Secondary | ICD-10-CM | POA: Insufficient documentation

## 2021-09-03 DIAGNOSIS — Z20822 Contact with and (suspected) exposure to covid-19: Secondary | ICD-10-CM | POA: Insufficient documentation

## 2021-09-03 LAB — GROUP A STREP BY PCR: Group A Strep by PCR: DETECTED — AB

## 2021-09-03 LAB — RESP PANEL BY RT-PCR (FLU A&B, COVID) ARPGX2
Influenza A by PCR: NEGATIVE
Influenza B by PCR: NEGATIVE
SARS Coronavirus 2 by RT PCR: NEGATIVE

## 2021-09-03 MED ORDER — AMOXICILLIN 875 MG PO TABS: 875.0000 mg | ORAL_TABLET | Freq: Two times a day (BID) | ORAL | 0 refills | Status: DC

## 2021-09-03 MED ORDER — MAGIC MOUTHWASH W/LIDOCAINE: 5.0000 mL | Freq: Four times a day (QID) | ORAL | 0 refills | Status: DC

## 2021-09-03 MED ORDER — AMOXICILLIN 500 MG PO CAPS
1000.0000 mg | ORAL_CAPSULE | Freq: Once | ORAL | Status: AC
Start: 2021-09-03 — End: 2021-09-03
  Administered 2021-09-03: 1000 mg via ORAL
  Filled 2021-09-03: qty 2

## 2021-09-03 NOTE — ED Provider Notes (Signed)
? ?[facility] ?Provider Note ? ?Patient Contact: 9:28 PM (approximate) ? ? ?History  ? ?Generalized Body Aches ? ? ?HPI ? ?George Cunningham. is a 26 y.o. male who presents the emergency department complaining of body aches, congestion, sore throat.  Patient has had symptoms x4 days.  Subjective tactile fevers reported.  Patient denies any difficulty breathing or swallowing, visual changes, chest pain, shortness of breath, nausea, vomiting, diarrhea.  Patient is unaware of any sick contacts. ?  ? ? ?Physical Exam  ? ?Triage Vital Signs: ?ED Triage Vitals  ?Enc Vitals Group  ?   BP 09/03/21 2022 (!) 132/96  ?   Pulse Rate 09/03/21 2022 (!) 111  ?   Resp 09/03/21 2022 19  ?   Temp 09/03/21 2022 98.5 ?F (36.9 ?C)  ?   Temp Source 09/03/21 2022 Oral  ?   SpO2 09/03/21 2022 96 %  ?   Weight 09/03/21 2024 230 lb (104.3 kg)  ?   Height 09/03/21 2024 6\' 4"  (1.93 m)  ?   Head Circumference --   ?   Peak Flow --   ?   Pain Score 09/03/21 2024 10  ?   Pain Loc --   ?   Pain Edu? --   ?   Excl. in GC? --   ? ? ?Most recent vital signs: ?Vitals:  ? 09/03/21 2022  ?BP: (!) 132/96  ?Pulse: (!) 111  ?Resp: 19  ?Temp: 98.5 ?F (36.9 ?C)  ?SpO2: 96%  ? ? ? ?General: Alert and in no acute distress. ?ENT: ?     Ears:  ?     Nose: No congestion/rhinnorhea. ?     Mouth/Throat: Mucous membranes are moist.  Tonsils are erythematous and edematous bilaterally.  Uvula is midline. ?Neck: No stridor. No cervical spine tenderness to palpation. ?Hematological/Lymphatic/Immunilogical: No cervical lymphadenopathy. ?Cardiovascular:  Good peripheral perfusion ?Respiratory: Normal respiratory effort without tachypnea or retractions. Lungs CTAB. Good air entry to the bases with no decreased or absent breath sounds. ?Musculoskeletal: Full range of motion to all extremities.  ?Neurologic:  No gross focal neurologic deficits are appreciated.  ?Skin:   No rash noted ?Other: ? ? ?ED Results / Procedures / Treatments   ? ?Labs ?(all labs ordered are listed, but only abnormal results are displayed) ?Labs Reviewed  ?GROUP A STREP BY PCR - Abnormal; Notable for the following components:  ?    Result Value  ? Group A Strep by PCR DETECTED (*)   ? All other components within normal limits  ?RESP PANEL BY RT-PCR (FLU A&B, COVID) ARPGX2  ? ? ? ?EKG ? ? ? ? ?RADIOLOGY ? ? ? ?No results found. ? ?PROCEDURES: ? ?Critical Care performed: No ? ?Procedures ? ? ?MEDICATIONS ORDERED IN ED: ?Medications  ?amoxicillin (AMOXIL) capsule 1,000 mg (has no administration in time range)  ? ? ? ?IMPRESSION / MDM / ASSESSMENT AND PLAN / ED COURSE  ?I reviewed the triage vital signs and the nursing notes. ?             ?               ? ?Differential diagnosis includes, but is not limited to, flu, COVID, strep ? ? ?Patient's diagnosis is consistent with strep.  Patient presented to the emergency department with sore throat, congestion, body aches x4 days.  No recent sick contacts.  Exam was overall reassuring.  Patient had COVID, flu, strep run with a  positive strep test.  Patient be treated with antibiotics starting here in the emergency department.  Patient was symptom control with Magic mouthwash.  Tylenol and Motrin at home.  Follow-up with primary care as needed.  Return precautions discussed with the patient..  Patient is given ED precautions to return to the ED for any worsening or new symptoms. ? ? ? ?  ? ? ?FINAL CLINICAL IMPRESSION(S) / ED DIAGNOSES  ? ?Final diagnoses:  ?Strep pharyngitis  ? ? ? ?Rx / DC Orders  ? ?ED Discharge Orders   ? ?      Ordered  ?  amoxicillin (AMOXIL) 875 MG tablet  2 times daily       ? 09/03/21 2151  ?  magic mouthwash w/lidocaine SOLN  4 times daily       ?Note to Pharmacy: Dispense in a 1/1/1 ratio. Use lidocaine, diphenhydramine, prednisolone  ? 09/03/21 2151  ? ?  ?  ? ?  ? ? ? ?Note:  This document was prepared using Dragon voice recognition software and may include unintentional dictation errors. ?   ?Racheal Patches, PA-C ?09/03/21 2207 ? ?  ?Chesley Noon, MD ?09/05/21 1112 ? ?

## 2021-09-03 NOTE — ED Triage Notes (Signed)
Pt presents to ER from home c/o body aches x4 days.  Pt denies being around anyone sick.  Pt states he has had cold chills, denies cough.  States he has had some nausea and vomiting earlier today.  Pt states he has taken tylenol and motrin for pain without relief.  Pt is A&O x4 at this time in NAD in triage.   ?

## 2021-11-01 ENCOUNTER — Emergency Department
Admission: EM | Admit: 2021-11-01 | Discharge: 2021-11-01 | Disposition: A | Discharge status: Home / Self Care | Payer: Self-pay | Attending: Emergency Medicine | Admitting: Emergency Medicine

## 2021-11-01 ENCOUNTER — Emergency Department: Payer: Self-pay

## 2021-11-01 DIAGNOSIS — K08419 Partial loss of teeth due to trauma, unspecified class: Secondary | ICD-10-CM | POA: Diagnosis not present

## 2021-11-01 DIAGNOSIS — Y92410 Unspecified street and highway as the place of occurrence of the external cause: Secondary | ICD-10-CM | POA: Insufficient documentation

## 2021-11-01 DIAGNOSIS — R55 Syncope and collapse: Secondary | ICD-10-CM | POA: Insufficient documentation

## 2021-11-01 DIAGNOSIS — R6884 Jaw pain: Secondary | ICD-10-CM | POA: Insufficient documentation

## 2021-11-01 DIAGNOSIS — K0889 Other specified disorders of teeth and supporting structures: Secondary | ICD-10-CM

## 2021-11-01 DIAGNOSIS — R519 Headache, unspecified: Secondary | ICD-10-CM | POA: Diagnosis not present

## 2021-11-01 MED ORDER — IBUPROFEN 800 MG PO TABS: 800.0000 mg | ORAL_TABLET | Freq: Three times a day (TID) | ORAL | 0 refills | Status: DC | PRN
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
## 2021-11-01 NOTE — Discharge Instructions (Addendum)
Please use a diet of soft foods for the next 3 to 5 days for any continued jaw pain
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
## 2022-04-07 IMAGING — US US SCROTUM W/ DOPPLER COMPLETE
1 series · 13 of 25 positions shown · non-contrast
Comparison: None.

CLINICAL DATA: Scrotal wall abscess.

EXAM:
SCROTAL ULTRASOUND
DOPPLER ULTRASOUND OF THE TESTICLES
TECHNIQUE: Complete ultrasound examination of the testicles, epididymis, and
other scrotal structures was performed. Color and spectral Doppler
ultrasound were also utilized to evaluate blood flow to the
testicles.

[Series 1: us scrotum w/doppler · 13 of 86 slices shown]
[im 1/86]
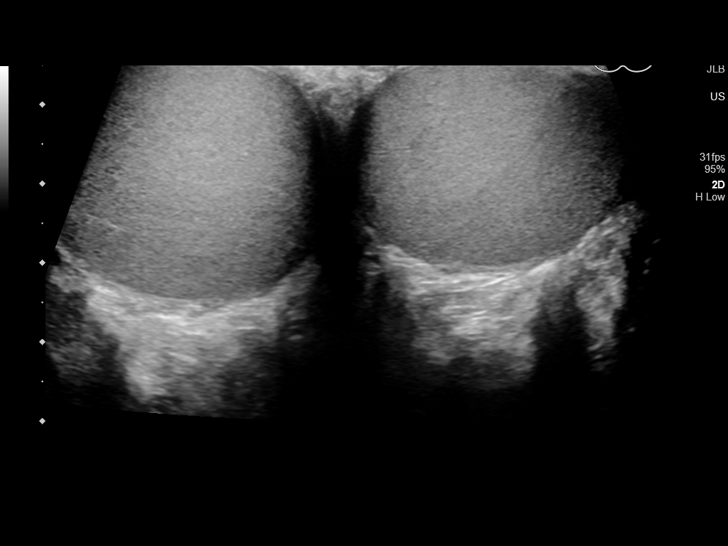
[im 8/86]
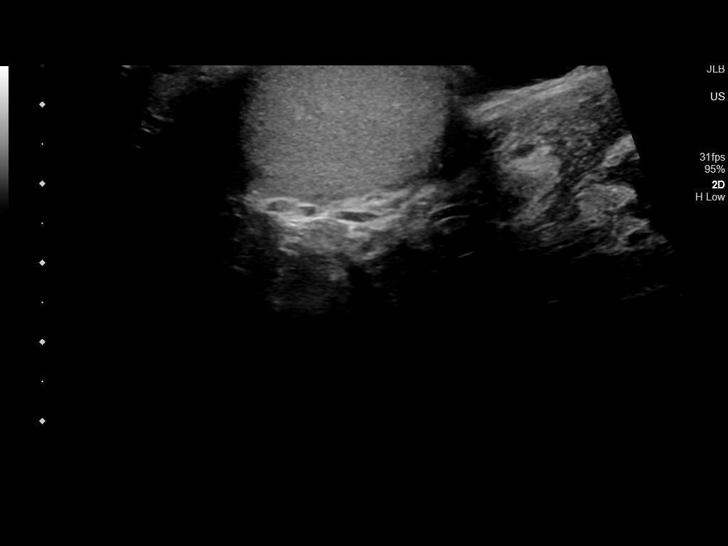
[im 15/86]
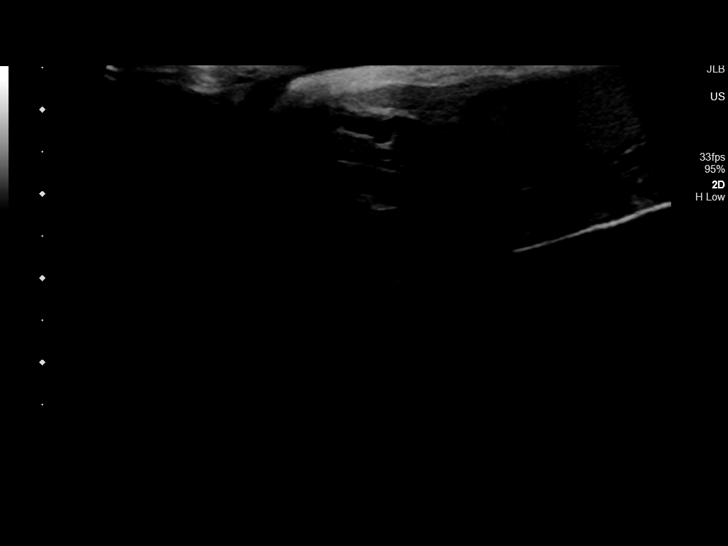
[im 22/86]
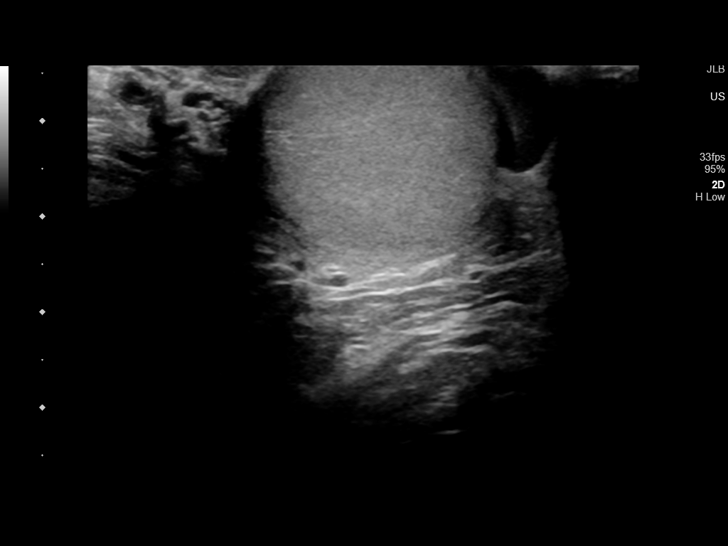
[im 29/86]
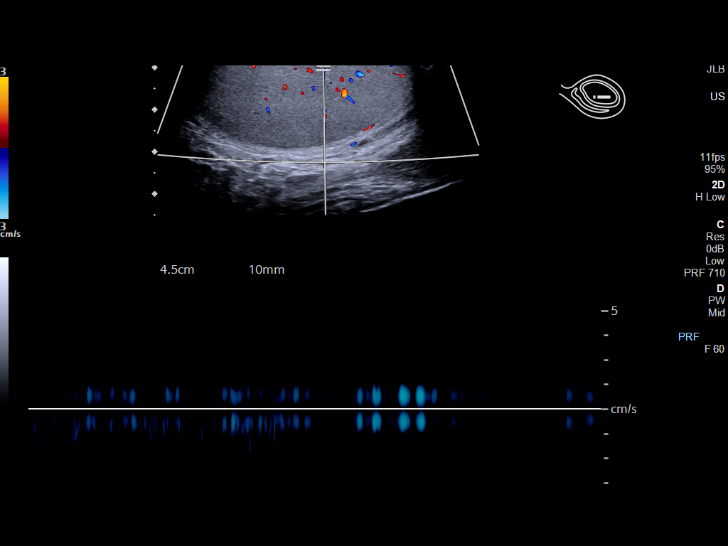
[im 36/86]
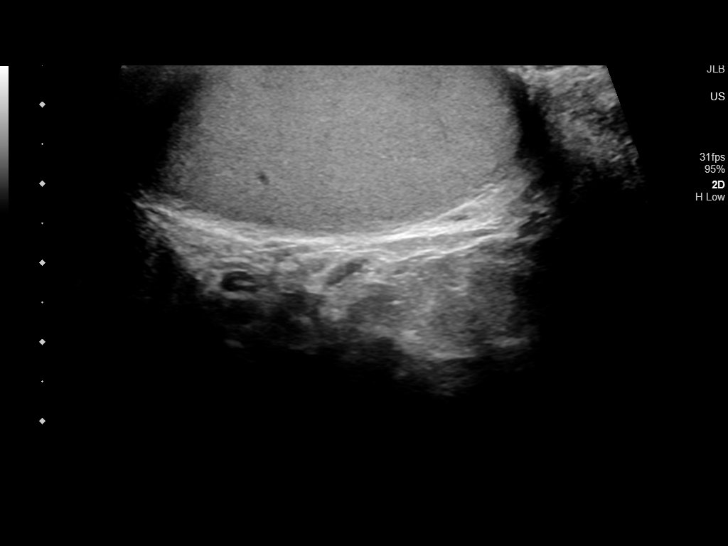
[im 43/86]
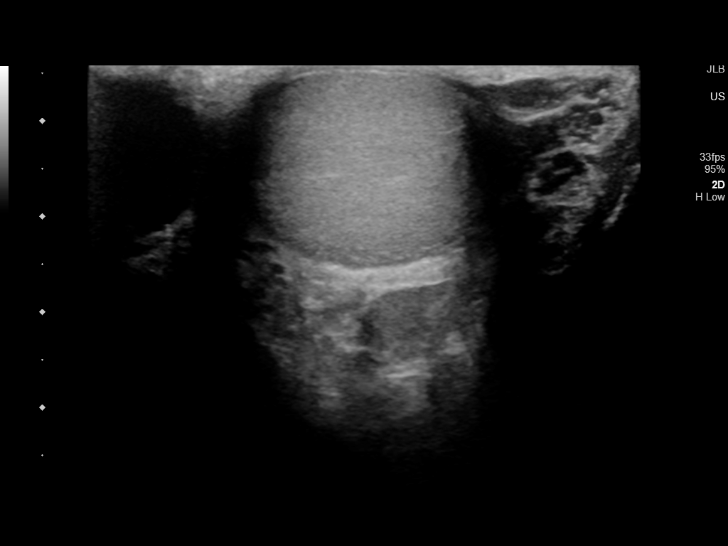
[im 50/86]
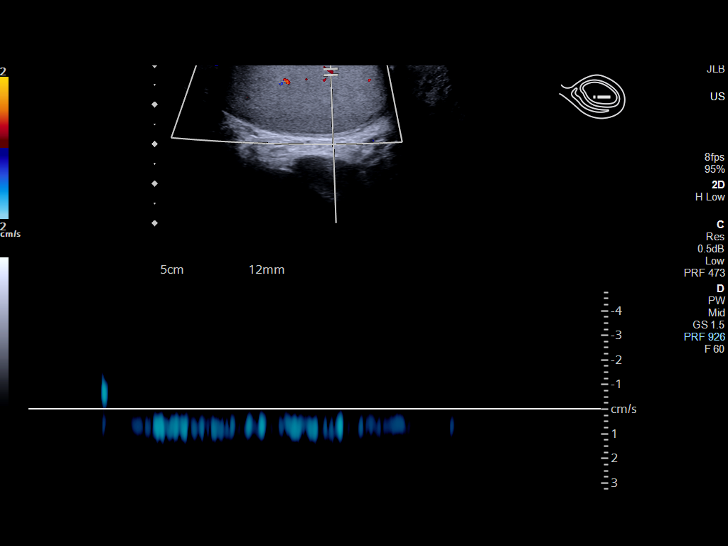
[im 57/86]
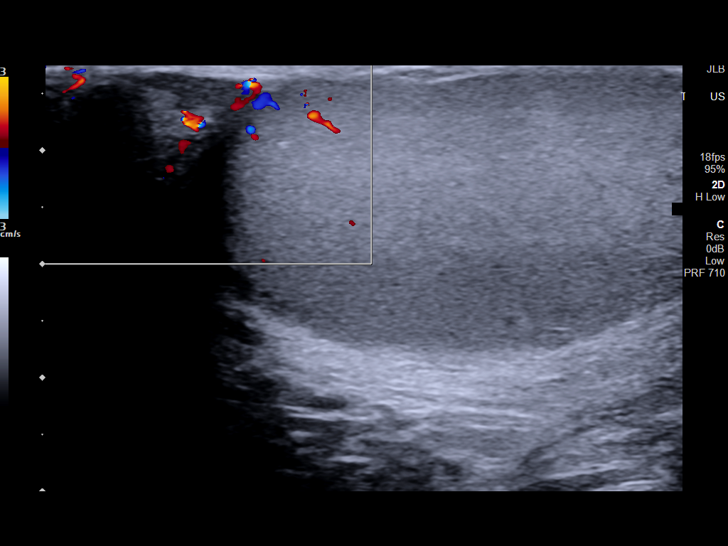
[im 64/86]
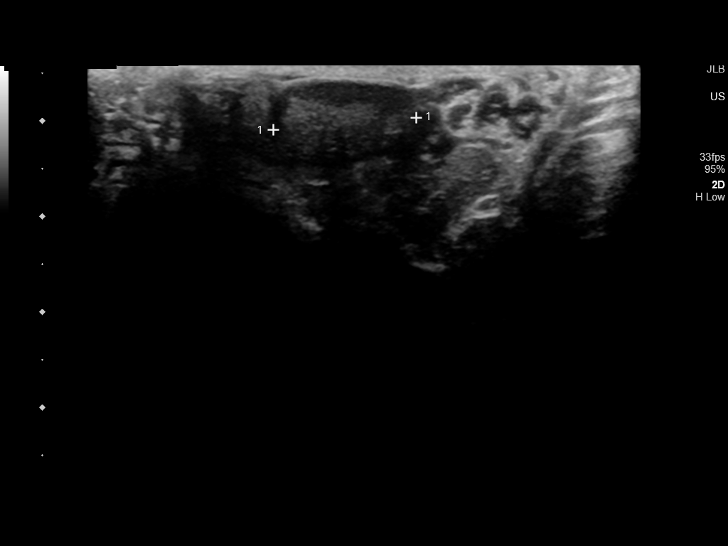
[im 71/86]
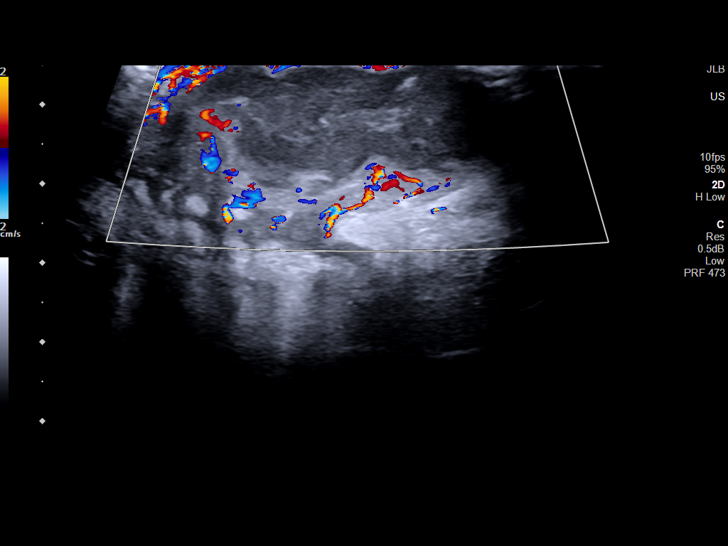
[im 78/86]
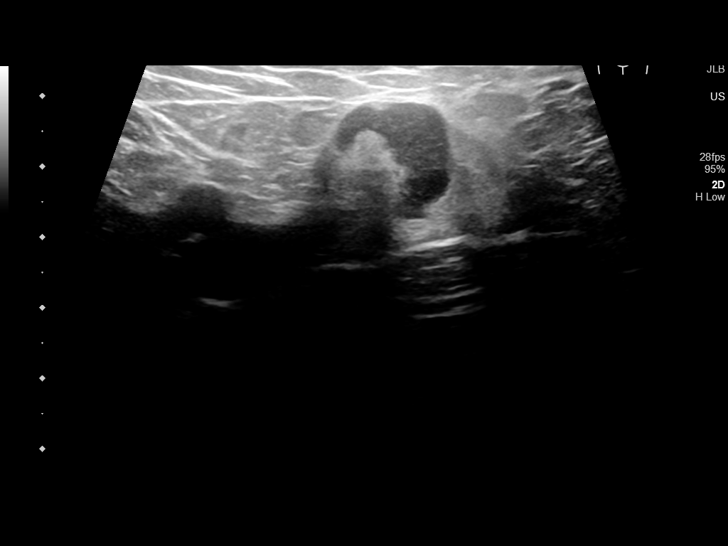
[im 86/86]
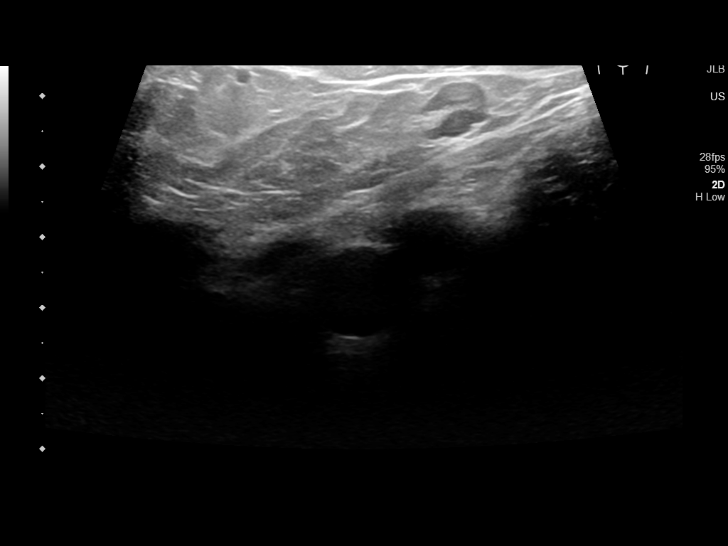

[13 of 25 positions shown; findings below may reference images not displayed]

FINDINGS: Right testicle

Measurements: 5.0 x 2.7 x 3.3 cm. No mass or microlithiasis
visualized.

Left testicle

Measurements: 5.3 x 2.3 x 3.2 cm. No mass or microlithiasis
visualized.

Right epididymis:  Normal in size and appearance.

Left epididymis:  Normal in size and appearance.

Hydrocele:  Small bilateral hydroceles.

Varicocele:  None visualized.

Pulsed Doppler interrogation of both testes demonstrates normal low
resistance arterial and venous waveforms bilaterally.

There is a 4.4 x 1.5 x 2.3 cm complex fluid collection with
surrounding hyperemia along the underside of the right scrotum
corresponding to the area of clinical concern.
IMPRESSION: 1. 4.4 x 1.5 x 2.3 cm abscess along the underside of the right
scrotum, corresponding to the area of clinical concern.
2. Normal sonographic appearance of the testicles.

## 2022-09-30 ENCOUNTER — Ambulatory Visit: Payer: Self-pay | Admitting: Physician Assistant

## 2022-09-30 DIAGNOSIS — Z113 Encounter for screening for infections with a predominantly sexual mode of transmission: Secondary | ICD-10-CM

## 2022-09-30 DIAGNOSIS — F1290 Cannabis use, unspecified, uncomplicated: Secondary | ICD-10-CM

## 2022-09-30 DIAGNOSIS — Z720 Tobacco use: Secondary | ICD-10-CM

## 2022-09-30 LAB — HM HIV SCREENING LAB: HM HIV Screening: NEGATIVE

## 2022-09-30 LAB — HM HEPATITIS C SCREENING LAB: HM Hepatitis Screen: NEGATIVE

## 2022-09-30 LAB — HEPATITIS B SURFACE ANTIGEN

## 2022-09-30 NOTE — Progress Notes (Signed)
[facility] Department STI clinic/screening visit  Subjective:  George Cunningham. is a 27 y.o. male being seen today for an STI screening visit. The patient reports they do not have symptoms.    Patient has the following medical conditions:  There are no problems to display for this patient.    Chief Complaint  Patient presents with   SEXUALLY TRANSMITTED DISEASE    New partner. Noticed an odd odor during sex.     27 yo man here for STI screening. Denies symptoms. Had neg Hep C testing January 08 2022, but has been incarcerated subsequently, released in Sept 2023. Smokes several cigs/day (trying to quit, has decreased use, but not ready to set quit date). Uses marijuana daily. No other concerns.   Last HIV test per patient/review of record was No results found for: "HMHIVSCREEN" No results found for: "HIV" Last HIV test 01/08/22 = neg.  Does the patient or their partner desires a pregnancy in the next year? Yes  Screening for MPX risk: Does the patient have an unexplained rash? No Is the patient MSM? No Does the patient endorse multiple sex partners or anonymous sex partners? Yes Did the patient have close or sexual contact with a person diagnosed with MPX? No Has the patient traveled outside the [place] where MPX is endemic? No Is there a high clinical suspicion for MPX-- evidenced by one of the following No  -Unlikely to be chickenpox  -Lymphadenopathy  -Rash that present in same phase of evolution on any given body part   See flowsheet for further details and programmatic requirements.    There is no immunization history on file for this patient.   The following portions of the patient's history were reviewed and updated as appropriate: allergies, current medications, past medical history, past social history, past surgical history and problem list.  Objective:  There were no vitals filed for this visit.  Physical Exam Vitals and nursing note reviewed.   Constitutional:      Appearance: Normal appearance.  HENT:     Head: Normocephalic and atraumatic.     Mouth/Throat:     Mouth: Mucous membranes are moist.     Pharynx: No oropharyngeal exudate or posterior oropharyngeal erythema.  Eyes:     General:        Right eye: No discharge.        Left eye: No discharge.     Conjunctiva/sclera:     Right eye: Right conjunctiva is not injected. No exudate.    Left eye: Left conjunctiva is not injected. No exudate. Pulmonary:     Effort: Pulmonary effort is normal.  Abdominal:     General: Abdomen is flat.     Palpations: Abdomen is soft. There is no hepatomegaly or mass.     Tenderness: There is no abdominal tenderness. There is no rebound.  Genitourinary:    Pubic Area: No rash.      Penis: Circumcised. No discharge or lesions.      Testes: Normal.        Right: Tenderness not present.        Left: Tenderness not present.     Epididymis:     Right: Normal.     Left: Normal.  Lymphadenopathy:     Cervical: No cervical adenopathy.     Upper Body:     Right upper body: No supraclavicular or axillary adenopathy.     Left upper body: No supraclavicular or axillary adenopathy.  Lower Body: No right inguinal adenopathy. No left inguinal adenopathy.  Skin:    General: Skin is warm and dry.  Neurological:     Mental Status: He is alert and oriented to person, place, and time.    Assessment and Plan:  George Cunningham. is a 27 y.o. male presenting to the [facility] Department for STI screening  1. Routine screening for STI (sexually transmitted infection) Await test results. Enc condoms with sex with all partners. Enc STI screen every 6 mo. - HBV Antigen/Antibody State Lab - HIV/HCV [facility] - Syphilis Serology, [place] [facility] - Chlamydia/GC NAA, Confirmation  2. Tobacco abuse Counseled cessation via 5As, NCQuitline. Also enc pt to discontinue marijuana use.   Patient does not have STI symptoms Patient  accepted all screenings including  urine GC/Chlamydia, and blood work for HIV/Syphilis. Patient meets criteria for HepB screening? Yes. Ordered? yes Patient meets criteria for HepC screening? Yes. Ordered? yes Recommended condom use with all sex Discussed importance of condom use for STI prevent  Treat positive test results per standing order. Discussed time line for State Lab results and that patient will be called with positive results and encouraged patient to call if he had not heard in 2 weeks Recommended repeat testing in 3 months with positive results. Recommended returning for continued or worsening symptoms.   Return in about 6 months (around 04/01/2023) for STI screening.  No future appointments.  Landry Dyke, PA-C

## 2022-10-04 LAB — CHLAMYDIA/GC NAA, CONFIRMATION
Chlamydia trachomatis, NAA: NEGATIVE
Neisseria gonorrhoeae, NAA: NEGATIVE

## 2022-11-17 ENCOUNTER — Other Ambulatory Visit: Payer: Self-pay

## 2022-11-17 ENCOUNTER — Emergency Department
Admission: EM | Admit: 2022-11-17 | Discharge: 2022-11-17 | Disposition: A | Discharge status: Home / Self Care | Payer: 59 | Attending: Emergency Medicine | Admitting: Emergency Medicine

## 2022-11-17 DIAGNOSIS — R202 Paresthesia of skin: Secondary | ICD-10-CM

## 2022-11-17 DIAGNOSIS — E119 Type 2 diabetes mellitus without complications: Secondary | ICD-10-CM | POA: Insufficient documentation

## 2022-11-17 DIAGNOSIS — Z87891 Personal history of nicotine dependence: Secondary | ICD-10-CM | POA: Diagnosis not present

## 2022-11-17 DIAGNOSIS — R200 Anesthesia of skin: Secondary | ICD-10-CM | POA: Diagnosis present

## 2022-11-17 LAB — BASIC METABOLIC PANEL
Anion gap: 8 (ref 5–15)
BUN: 14 mg/dL (ref 6–20)
CO2: 25 mmol/L (ref 22–32)
Calcium: 9.4 mg/dL (ref 8.9–10.3)
Chloride: 107 mmol/L (ref 98–111)
Creatinine, Ser: 0.86 mg/dL (ref 0.61–1.24)
GFR, Estimated: >60 mL/min (ref >60)
Glucose, Bld: 122 mg/dL — ABNORMAL HIGH (ref 70–99)
Potassium: 3.6 mmol/L (ref 3.5–5.1)
Sodium: 140 mmol/L (ref 135–145)

## 2022-11-17 LAB — CBC
HCT: 42.7 % (ref 39.0–52.0)
Hemoglobin: 14.2 g/dL (ref 13.0–17.0)
MCH: 30.4 pg (ref 26.0–34.0)
MCHC: 33.3 g/dL (ref 30.0–36.0)
MCV: 91.4 fL (ref 80.0–100.0)
Platelets: 264 10*3/uL (ref 150–400)
RBC: 4.67 MIL/uL (ref 4.22–5.81)
RDW: 12.9 % (ref 11.5–15.5)
WBC: 9.3 10*3/uL (ref 4.0–10.5)
nRBC: 0 % (ref 0.0–0.2)

## 2022-11-17 MED ORDER — GABAPENTIN 100 MG PO CAPS
100.0000 mg | ORAL_CAPSULE | Freq: Three times a day (TID) | ORAL | 0 refills | Status: DC
Start: 2022-11-17 — End: 2022-11-26
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
## 2022-11-17 NOTE — Discharge Instructions (Addendum)
You may have neuropathy which will need further workup from either primary care doctor or neurology.  Please follow-up with one of the primary care doctors listed below.  You can start taking the gabapentin 100 mg 3 times a day.  The dose can be increased by your primary care doctor.  Please go to the following website to schedule new (and existing) patient appointments:   [email or web address]   The following is a list of primary care offices in the area who are accepting new patients at this time.  Please reach out to one of them directly and let them know you would like to schedule an appointment to follow up on an Emergency Department visit, and/or to establish a new primary care provider (PCP).  There are likely other primary care clinics in the are who are accepting new patients, but this is an excellent place to start:  [facility] Lead physician: Dr Shirlee Latch [address] [place], [place] [address] [phone]  [facility] Lead Physician: Dr Alba Cory [address], [place], [place] [address] [phone]  [facility]  Lead Physician: Dr Olevia Perches [address] [place], [place] [address] [phone]  [facility] Lead Physician: Dr Sofie Hartigan [address] [place], [place] [address] [phone]  [facility] Lead Physician: Dr Bari Edward [address] [place], [place], [place] [address] [phone]

## 2022-11-21 ENCOUNTER — Other Ambulatory Visit: Payer: Self-pay

## 2022-11-21 ENCOUNTER — Emergency Department
Admission: EM | Admit: 2022-11-21 | Discharge: 2022-11-22 | Disposition: A | Discharge status: Home / Self Care | Payer: 59 | Attending: Emergency Medicine | Admitting: Emergency Medicine

## 2022-11-21 DIAGNOSIS — R202 Paresthesia of skin: Secondary | ICD-10-CM | POA: Diagnosis not present

## 2022-11-21 DIAGNOSIS — M79673 Pain in unspecified foot: Secondary | ICD-10-CM | POA: Diagnosis not present

## 2022-11-21 MED ORDER — KETOROLAC TROMETHAMINE 30 MG/ML IJ SOLN
30.0000 mg | Freq: Once | INTRAMUSCULAR | Status: AC
  Administered 2022-11-22: 30 mg via INTRAMUSCULAR
  Filled 2022-11-21: qty 1

## 2022-11-21 NOTE — Discharge Instructions (Addendum)
Also call the neurologist for an appointment to see them in the clinic.

## 2022-11-21 NOTE — ED Triage Notes (Signed)
Pt presents to ER with c/o right foot pain that pt states has become worse since being seen here for similar sx 6/5.  Pt states he feels as if his right foot is swelling and feeling more numb since then.  Pt denies any new injuries.  Pt reports he has been taking rx gabapentin without relief of sx.  No appreciable swelling to right foot on assessment.  No warmth or redness noted.  Pedal pulses 2+ BIL.  Pt ambulatory to triage and is A&O x4.

## 2022-11-21 NOTE — ED Provider Notes (Signed)
[facility] Provider Note    Event Date/Time   First MD Initiated Contact with Patient 11/21/22 2345     (approximate)   History   Foot Pain   HPI  George Pinta. is a 27 y.o. male who presents to the ED for evaluation of Foot Pain   I review ED visit from 4 days ago where patient was evaluated for paresthesias to various parts of his body.  Chronic back pain.  Normal electrolytes.  Referred to PCP and discharged.  Patient presents to the ED for evaluation of progression of paresthesias and worsening of symptoms in the past few days.  Reports feeling like his symptoms are worse in the setting of gabapentin.  He reports the same sort of symptoms, numbness, tingling, worse with ambulation, as when he was seen a few days ago.  Denies any other novel features beyond severity increase.  Denies any falls, injuries or trauma.  No fevers, swelling, skin changes.  He has an appointment with PCP, but not for 1 month.  He is requesting crutches   Physical Exam   Triage Vital Signs: ED Triage Vitals  Enc Vitals Group     BP 11/21/22 2130 (!) 137/97     Pulse Rate 11/21/22 2130 87     Resp 11/21/22 2130 16     Temp 11/21/22 2130 98.3 F (36.8 C)     Temp Source 11/21/22 2130 Oral     SpO2 11/21/22 2130 97 %     Weight --      Height --      Head Circumference --      Peak Flow --      Pain Score 11/21/22 2133 8     Pain Loc --      Pain Edu? --      Excl. in GC? --     Most recent vital signs: Vitals:   11/21/22 2130  BP: (!) 137/97  Pulse: 87  Resp: 16  Temp: 98.3 F (36.8 C)  SpO2: 97%    General: Awake, no distress.  CV:  Good peripheral perfusion.  Resp:  Normal effort.  Abd:  No distention.  MSK:  No deformity noted.  No apparent skin changes, deformity, swelling or focal derangements on exam to his areas of pain Neuro:  No focal deficits appreciated. Cranial nerves II through XII intact 5/5 strength and sensation in all 4  extremities Other:     ED Results / Procedures / Treatments   Labs (all labs ordered are listed, but only abnormal results are displayed) Labs Reviewed - No data to display  EKG   RADIOLOGY   Official radiology report(s): No results found.  PROCEDURES and INTERVENTIONS:  Procedures  Medications  ketorolac (TORADOL) 30 MG/ML injection 30 mg (30 mg Intramuscular Given 11/22/22 0003)     IMPRESSION / MDM / ASSESSMENT AND PLAN / ED COURSE  I reviewed the triage vital signs and the nursing notes.  Differential diagnosis includes, but is not limited to, cauda equina, peripheral neuropathy, fracture, cellulitis  Patient presents with continued chronic paresthesias that are fairly diffuse throughout his body, but most severe to his bilateral feet R > L.  He looks well and has a reassuring neurologic exam.  He had normal electrolyte a couple days ago and I see no indication for repeat of this.  [facility] and had a negative RPR.  He has follow-up with the  PCP next month.  We will also refer to neurology for considerations of nerve studies.  Provided crutches at his request.  Suitable for outpatient management       FINAL CLINICAL IMPRESSION(S) / ED DIAGNOSES   Final diagnoses:  Paresthesias     Rx / DC Orders   ED Discharge Orders     None        Note:  This document was prepared using Dragon voice recognition software and may include unintentional dictation errors.   Delton Prairie, MD 11/22/22 [phone]
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
## 2022-11-26 ENCOUNTER — Other Ambulatory Visit (INDEPENDENT_AMBULATORY_CARE_PROVIDER_SITE_OTHER): Payer: 59

## 2022-11-26 ENCOUNTER — Encounter: Payer: Self-pay | Admitting: Neurology

## 2022-11-26 ENCOUNTER — Ambulatory Visit (INDEPENDENT_AMBULATORY_CARE_PROVIDER_SITE_OTHER): Payer: 59 | Admitting: Neurology

## 2022-11-26 VITALS — BP 154/89 | HR 80 | Ht 76.0 in | Wt 208.0 lb

## 2022-11-26 DIAGNOSIS — R200 Anesthesia of skin: Secondary | ICD-10-CM

## 2022-11-26 DIAGNOSIS — R202 Paresthesia of skin: Secondary | ICD-10-CM

## 2022-11-26 DIAGNOSIS — Z131 Encounter for screening for diabetes mellitus: Secondary | ICD-10-CM

## 2022-11-26 DIAGNOSIS — R29898 Other symptoms and signs involving the musculoskeletal system: Secondary | ICD-10-CM

## 2022-11-26 DIAGNOSIS — F1290 Cannabis use, unspecified, uncomplicated: Secondary | ICD-10-CM

## 2022-11-26 DIAGNOSIS — M792 Neuralgia and neuritis, unspecified: Secondary | ICD-10-CM | POA: Diagnosis not present

## 2022-11-26 DIAGNOSIS — G5623 Lesion of ulnar nerve, bilateral upper limbs: Secondary | ICD-10-CM

## 2022-11-26 DIAGNOSIS — F17210 Nicotine dependence, cigarettes, uncomplicated: Secondary | ICD-10-CM | POA: Diagnosis not present

## 2022-11-26 DIAGNOSIS — Z720 Tobacco use: Secondary | ICD-10-CM

## 2022-11-26 LAB — VITAMIN B12: Vitamin B-12: 228 pg/mL (ref 211–911)

## 2022-11-26 LAB — HEMOGLOBIN A1C: Hgb A1c MFr Bld: 6.6 % — ABNORMAL HIGH (ref 4.6–6.5)

## 2022-11-26 MED ORDER — DULOXETINE HCL 30 MG PO CPEP: 30.0000 mg | ORAL_CAPSULE | Freq: Every day | ORAL | 5 refills | Status: AC

## 2022-11-26 NOTE — Patient Instructions (Addendum)
I saw you today for numbness and tingling.  The symptoms in your arms could be due to a pinched nerve at your elbow. I am less clear on why you have numbness and tingling in your feet. I want to investigate further with the following: -Blood work today -Muscle and nerve test called an EMG (see more information below)  I will be in touch when I have the results of those tests to discuss what we need to do next.  I recommend you try not to lean on your elbows or sleep with them bent. You may consider wearing elbow pads to prevent this nerve from being pressed on and let it heal.  I am prescribing a nerve pain medication called Cymbalta. You will take 30 mg at bedtime as it can make you sleepy. This medication can take a few weeks to work. This is also a low dose, so we can always go up on the medication.  You can also try Lidocaine cream as needed. Apply wear you have pain, tingling, or burning. Wear gloves to prevent your hands being numb. This can be bought over the counter at any drug store or online.   I would like to see you back in clinic in about 3 months to see how you are doing.  The physicians and staff at [facility] are committed to providing excellent care. You may receive a survey requesting feedback about your experience at our office. We strive to receive "very good" responses to the survey questions. If you feel that your experience would prevent you from giving the office a "very good " response, please contact our office to try to remedy the situation. We may be reached at [phone]. Thank you for taking the time out of your busy day to complete the survey.  Jacquelyne Balint, MD [facility]  ELECTROMYOGRAM AND NERVE CONDUCTION STUDIES (EMG/NCS) INSTRUCTIONS  How to Prepare The neurologist conducting the EMG will need to know if you have certain medical conditions. Tell the neurologist and other EMG lab personnel if you: Have a pacemaker or any other electrical  medical device Take blood-thinning medications Have hemophilia, a blood-clotting disorder that causes prolonged bleeding Bathing Take a shower or bath shortly before your exam in order to remove oils from your skin. Don't apply lotions or creams before the exam.  What to Expect You'll likely be asked to change into a hospital gown for the procedure and lie down on an examination table. The following explanations can help you understand what will happen during the exam.  Electrodes. The neurologist or a technician places surface electrodes at various locations on your skin depending on where you're experiencing symptoms. Or the neurologist may insert needle electrodes at different sites depending on your symptoms.  Sensations. The electrodes will at times transmit a tiny electrical current that you may feel as a twinge or spasm. The needle electrode may cause discomfort or pain that usually ends shortly after the needle is removed. If you are concerned about discomfort or pain, you may want to talk to the neurologist about taking a short break during the exam.  Instructions. During the needle EMG, the neurologist will assess whether there is any spontaneous electrical activity when the muscle is at rest - activity that isn't present in healthy muscle tissue - and the degree of activity when you slightly contract the muscle.  He or she will give you instructions on resting and contracting a muscle at appropriate times. Depending on what muscles  and nerves the neurologist is examining, he or she may ask you to change positions during the exam.  After your EMG You may experience some temporary, minor bruising where the needle electrode was inserted into your muscle. This bruising should fade within several days. If it persists, contact your primary care doctor.

## 2022-11-30 ENCOUNTER — Encounter: Payer: Self-pay | Admitting: Neurology

## 2022-11-30 ENCOUNTER — Encounter: Payer: 59 | Admitting: Neurology

## 2022-11-30 DIAGNOSIS — Z029 Encounter for administrative examinations, unspecified: Secondary | ICD-10-CM

## 2022-12-01 LAB — IMMUNOFIXATION ELECTROPHORESIS
IgG (Immunoglobin G), Serum: 1282 mg/dL (ref 600–1640)
IgM, Serum: 28 mg/dL — ABNORMAL LOW (ref 50–300)
Immunoglobulin A: 105 mg/dL (ref 47–310)

## 2022-12-01 LAB — VITAMIN B1: Vitamin B1 (Thiamine): 10 nmol/L (ref 8–30)

## 2022-12-02 ENCOUNTER — Encounter: Payer: Self-pay | Admitting: Neurology

## 2022-12-14 ENCOUNTER — Telehealth: Payer: Self-pay

## 2022-12-14 NOTE — Telephone Encounter (Signed)
Called patient and gave results and recommendations for f/U with PCP for diabetes and the b-12 supplements I also asked him to call office to reschedule the EMG

## 2022-12-29 ENCOUNTER — Ambulatory Visit: Payer: 59 | Admitting: Nurse Practitioner
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
## 2023-02-24 ENCOUNTER — Ambulatory Visit: Payer: No Typology Code available for payment source | Admitting: Neurology

## 2023-03-03 ENCOUNTER — Ambulatory Visit: Payer: 59 | Admitting: Neurology

## 2023-03-03 ENCOUNTER — Encounter: Payer: Self-pay | Admitting: Neurology

## 2023-03-03 DIAGNOSIS — Z029 Encounter for administrative examinations, unspecified: Secondary | ICD-10-CM
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
## 2023-04-13 ENCOUNTER — Ambulatory Visit (LOCAL_COMMUNITY_HEALTH_CENTER): Payer: Self-pay

## 2023-04-13 DIAGNOSIS — Z719 Counseling, unspecified: Secondary | ICD-10-CM

## 2023-04-13 DIAGNOSIS — Z23 Encounter for immunization: Secondary | ICD-10-CM

## 2023-04-13 NOTE — Progress Notes (Signed)
In nurse clinic for immunizations. Patient requesting Covid Comirnaty 12+ vaccine. Voices no concerns. VIS reviewed and given to patient. Vaccine tolerated well; no issues noted. Patient monitored; no problems. [facility] updated and copy given to patient. All questions answered and verbalizes understanding.   Abagail Kitchens, RN

## 2023-06-13 ENCOUNTER — Emergency Department
Admission: EM | Admit: 2023-06-13 | Discharge: 2023-06-13 | Disposition: A | Discharge status: Home / Self Care | Payer: 59 | Attending: Emergency Medicine | Admitting: Emergency Medicine

## 2023-06-13 ENCOUNTER — Other Ambulatory Visit: Payer: Self-pay

## 2023-06-13 DIAGNOSIS — Z202 Contact with and (suspected) exposure to infections with a predominantly sexual mode of transmission: Secondary | ICD-10-CM | POA: Insufficient documentation

## 2023-06-13 LAB — URINALYSIS, ROUTINE W REFLEX MICROSCOPIC
Bilirubin Urine: NEGATIVE
Glucose, UA: NEGATIVE mg/dL
Hgb urine dipstick: NEGATIVE
Ketones, ur: NEGATIVE mg/dL
Leukocytes,Ua: NEGATIVE
Nitrite: NEGATIVE
Protein, ur: NEGATIVE mg/dL
Specific Gravity, Urine: 1.024 (ref 1.005–1.030)
pH: 5 (ref 5.0–8.0)

## 2023-06-13 LAB — CHLAMYDIA/NGC RT PCR (ARMC ONLY)
Chlamydia Tr: NOT DETECTED
N gonorrhoeae: NOT DETECTED

## 2023-06-13 MED ORDER — METRONIDAZOLE 500 MG PO TABS
2000.0000 mg | ORAL_TABLET | Freq: Once | ORAL | Status: AC
  Administered 2023-06-13: 2000 mg via ORAL
  Filled 2023-06-13: qty 4

## 2023-06-13 NOTE — ED Triage Notes (Signed)
Pt presents to ED with c/o of possible STI, pt unsure of exposure but states "my stuff feels weird". NAD noted.
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
## 2023-06-13 NOTE — Discharge Instructions (Signed)
You have been treated for trichomoniasis in the ED. Your remaining labs are still pending. If positive, prescriptions will be sent to your pharmacy. Your partner(s) need to be tested, treated, and symptom-free before any sexual contact. You may follow your results on [facility] MyChart.

## 2023-10-22 ENCOUNTER — Other Ambulatory Visit: Payer: Self-pay

## 2023-10-22 ENCOUNTER — Encounter: Payer: Self-pay | Admitting: Emergency Medicine

## 2023-10-22 ENCOUNTER — Emergency Department
Admission: EM | Admit: 2023-10-22 | Discharge: 2023-10-22 | Disposition: A | Discharge status: Home / Self Care | Payer: Self-pay | Attending: Emergency Medicine | Admitting: Emergency Medicine

## 2023-10-22 DIAGNOSIS — R21 Rash and other nonspecific skin eruption: Secondary | ICD-10-CM | POA: Insufficient documentation

## 2023-10-22 MED ORDER — PREDNISONE 50 MG PO TABS
50.0000 mg | ORAL_TABLET | Freq: Every day | ORAL | 0 refills | Status: AC
Start: 2023-10-22 — End: 2023-10-26

## 2023-10-22 MED ORDER — DIPHENHYDRAMINE HCL 25 MG PO CAPS
25.0000 mg | ORAL_CAPSULE | Freq: Once | ORAL | Status: AC
  Administered 2023-10-22: 25 mg via ORAL
  Filled 2023-10-22: qty 1

## 2023-10-22 MED ORDER — PREDNISONE 20 MG PO TABS
50.0000 mg | ORAL_TABLET | Freq: Once | ORAL | Status: AC
  Administered 2023-10-22: 50 mg via ORAL
  Filled 2023-10-22: qty 3

## 2023-10-22 MED ORDER — CETIRIZINE HCL 10 MG PO TABS: 10.0000 mg | ORAL_TABLET | Freq: Every day | ORAL | 0 refills | Status: AC

## 2023-10-22 NOTE — ED Triage Notes (Signed)
 Pt in with itchy rash to bilateral arms and abdomen, states ongoing x 1 wk. "Seems to flare while I'm at work", pt has not tried any topical creams or Benadryl

## 2023-10-22 NOTE — Discharge Instructions (Addendum)
 You have been diagnosed with a rash secondary to the dry skin.  Please take prednisone 1 tablet with breakfast.  Please take to take 1 tablet daily.  Please apply moisturizer  in your skin.  Please drink plenty of fluids.  Do not take hot showers.  Please come back to ED or go to your PCP if you have new symptoms or symptoms worsen

## 2023-10-22 NOTE — ED Provider Notes (Cosign Needed Addendum)
 [facility] Provider Note    Event Date/Time   First MD Initiated Contact with Patient 10/22/23 2214     (approximate)   History   Rash    HPI  George Cunningham. is a 28 y.o. male    with a past medical history of , with no significant past medical history who presents to the ED complaining of rash. According to the patient, when he is at work he has pruritus in the knees both forearms.  Did not tried any Benadryl.      Physical Exam   Triage Vital Signs: ED Triage Vitals  Encounter Vitals Group     BP 10/22/23 2158 (!) 123/90     Systolic BP Percentile --      Diastolic BP Percentile --      Pulse Rate 10/22/23 2158 74     Resp 10/22/23 2158 18     Temp 10/22/23 2158 97.7 F (36.5 C)     Temp Source 10/22/23 2158 Oral     SpO2 10/22/23 2158 99 %     Weight 10/22/23 2154 220 lb (99.8 kg)     Height --      Head Circumference --      Peak Flow --      Pain Score 10/22/23 2154 3     Pain Loc --      Pain Education --      Exclude from Growth Chart --     Most recent vital signs: Vitals:   10/22/23 2158  BP: (!) 123/90  Pulse: 74  Resp: 18  Temp: 97.7 F (36.5 C)  SpO2: 99%     Constitutional: Alert, NAD. Able to speak in complete sentences without cough or dyspnea  Eyes: Conjunctivae are normal.  Head: Atraumatic. Nose: No congestion/rhinnorhea. Mouth/Throat: Mucous membranes are moist.   Neck: Painless ROM. Supple. No JVD, nodes, thyromegaly  Cardiovascular:   Good peripheral circulation.RRR no murmurs, gallops, rubs  Respiratory: Normal respiratory effort.  No retractions. Clear to auscultation bilaterally without wheezing or crackles  Gastrointestinal: Soft and nontender.  Musculoskeletal:  no deformity Neurologic:  MAE spontaneously. No gross focal neurologic deficits are appreciated.  Skin:  Skin is warm, dry and intact. Rash noted in both forearms, hypertrophic arrector pili muscle Psychiatric: Mood and affect  are normal. Speech and behavior are normal.    ED Results / Procedures / Treatments   Labs (all labs ordered are listed, but only abnormal results are displayed) Labs Reviewed - No data to display   EKG   RADIOLOGY I    PROCEDURES:  Critical Care performed:   Procedures   MEDICATIONS ORDERED IN ED: Medications  predniSONE (DELTASONE) tablet 50 mg (has no administration in time range)  diphenhydrAMINE (BENADRYL) capsule 25 mg (has no administration in time range)      IMPRESSION / MDM / ASSESSMENT AND PLAN / ED COURSE  I reviewed the triage vital signs and the nursing notes.  Differential diagnosis includes, but is not limited to, rash, urticaria, cellulitis, abscess  Patient's presentation is most consistent with acute, uncomplicated illness.   Patient's diagnosis is consistent with rash secondary to dry skin.  I did review the patient's allergies and medications.The patient is in stable and satisfactory condition for discharge home  Patient will be discharged home with prescriptions for prednisone, cetirizine. Patient is to follow up with PCP as needed or otherwise directed. Patient is given ED precautions to return to the ED for  any worsening or new symptoms. Discussed plan of care with patient, answered all of patient's questions, Patient agreeable to plan of care. Advised patient to take medications according to the instructions on the label. Discussed possible side effects of new medications. Patient verbalized understanding.    FINAL CLINICAL IMPRESSION(S) / ED DIAGNOSES   Final diagnoses:  Rash     Rx / DC Orders   ED Discharge Orders          Ordered    predniSONE (DELTASONE) 50 MG tablet  Daily with breakfast        10/22/23 2333    cetirizine (ZYRTEC) 10 MG tablet  Daily        10/22/23 2333             Note:  This document was prepared using Dragon voice recognition software and may include unintentional dictation errors.   Awilda Lennox, PA-C 10/22/23 2335    Awilda Lennox, PA-C 10/22/23 2337    Kandee Orion, MD 10/23/23 [phone]
